# Patient Record
Sex: Female | Born: 1950 | Race: Black or African American | Hispanic: No | Smoking: Never smoker
Health system: Southern US, Community
[De-identification: ages and names within clinical notes are randomized; demographics above are authoritative.]

## PROBLEM LIST (undated history)

## (undated) DIAGNOSIS — I319 Disease of pericardium, unspecified: Secondary | ICD-10-CM

## (undated) DIAGNOSIS — I10 Essential (primary) hypertension: Secondary | ICD-10-CM

## (undated) DIAGNOSIS — K5909 Other constipation: Secondary | ICD-10-CM

## (undated) DIAGNOSIS — M5126 Other intervertebral disc displacement, lumbar region: Secondary | ICD-10-CM

## (undated) DIAGNOSIS — G4733 Obstructive sleep apnea (adult) (pediatric): Secondary | ICD-10-CM

## (undated) DIAGNOSIS — K5790 Diverticulosis of intestine, part unspecified, without perforation or abscess without bleeding: Secondary | ICD-10-CM

## (undated) DIAGNOSIS — K589 Irritable bowel syndrome without diarrhea: Secondary | ICD-10-CM

## (undated) DIAGNOSIS — E119 Type 2 diabetes mellitus without complications: Secondary | ICD-10-CM

## (undated) HISTORY — DX: Irritable bowel syndrome without diarrhea: K58.9

## (undated) HISTORY — DX: Obstructive sleep apnea (adult) (pediatric): G47.33

## (undated) HISTORY — DX: Other constipation: K59.09

## (undated) HISTORY — DX: Diverticulosis of intestine, part unspecified, without perforation or abscess without bleeding: K57.90

---

## 1974-01-14 HISTORY — PX: TUBAL LIGATION: SHX77

## 1997-08-04 ENCOUNTER — Emergency Department (HOSPITAL_COMMUNITY): Admission: EM | Admit: 1997-08-04 | Discharge: 1997-08-04 | Payer: Self-pay

## 1997-11-09 ENCOUNTER — Other Ambulatory Visit: Admission: RE | Admit: 1997-11-09 | Discharge: 1997-11-09 | Payer: Self-pay | Admitting: Obstetrics and Gynecology

## 1999-08-09 ENCOUNTER — Ambulatory Visit (HOSPITAL_COMMUNITY): Admission: RE | Admit: 1999-08-09 | Discharge: 1999-08-09 | Payer: Self-pay | Admitting: *Deleted

## 1999-09-27 ENCOUNTER — Emergency Department (HOSPITAL_COMMUNITY): Admission: EM | Admit: 1999-09-27 | Discharge: 1999-09-27 | Payer: Self-pay | Admitting: Emergency Medicine

## 1999-09-27 ENCOUNTER — Encounter: Payer: Self-pay | Admitting: Emergency Medicine

## 1999-11-21 ENCOUNTER — Other Ambulatory Visit: Admission: RE | Admit: 1999-11-21 | Discharge: 1999-11-21 | Payer: Self-pay | Admitting: Gastroenterology

## 1999-11-21 ENCOUNTER — Encounter (INDEPENDENT_AMBULATORY_CARE_PROVIDER_SITE_OTHER): Payer: Self-pay | Admitting: Specialist

## 2000-06-23 ENCOUNTER — Ambulatory Visit (HOSPITAL_COMMUNITY): Admission: RE | Admit: 2000-06-23 | Discharge: 2000-06-23 | Payer: Self-pay | Admitting: Gastroenterology

## 2001-12-08 ENCOUNTER — Emergency Department (HOSPITAL_COMMUNITY): Admission: EM | Admit: 2001-12-08 | Discharge: 2001-12-08 | Payer: Self-pay | Admitting: Emergency Medicine

## 2001-12-09 ENCOUNTER — Encounter: Payer: Self-pay | Admitting: Emergency Medicine

## 2008-06-23 ENCOUNTER — Encounter: Admission: RE | Admit: 2008-06-23 | Discharge: 2008-06-23 | Payer: Self-pay | Admitting: Family Medicine

## 2008-11-16 ENCOUNTER — Encounter: Admission: RE | Admit: 2008-11-16 | Discharge: 2009-01-11 | Payer: Self-pay | Admitting: Family Medicine

## 2010-06-01 NOTE — Cardiovascular Report (Signed)
[place]. [facility]  Patient:    Emily Huynh, Emily Huynh                          MRN: [id number] Proc. Date: 08/09/99 Adm. Date:  [id number] Attending:  Meade Maw A CC:         Duncan Dull, M.D.                        Cardiac Catheterization  PROCEDURES: 1. Left heart catheterization. 2. Coronary angiography. 3. Single plane ventriculogram.  INDICATION:  Defect in the anterior wall with questionable redistribution.  REFERRING PHYSICIAN:  Duncan Dull, M.D.  DESCRIPTION OF PROCEDURE:  After obtaining written informed consent, the patient was brought to the cardiac catheterization lab in the postabsorbtive state.  Preoperative sedation was achieved using IV Versed.  Right groin was prepped and draped in the usual sterile fashion.  Local anesthesia was achieved using 1% Xylocaine.  A 6 French sheath was placed into the right femoral artery using modified Seldinger technique.  Selective coronary angiography was performed using JL4 and JR4 Judkins catheter.  Single plane ventriculogram was performed in the RAO position using a 6 French pigtail curved catheters.  All catheters exchanges were made over the guide wire.  The hemostasis sheath was placed following 18 Judkins.  There was no identifiable coronary artery disease.  The patient was transferred to the holding area.  Hemostasis was achieved using additional pressure.  RESULTS:  Hemodynamic data: 1. Aortic pressure is 119/71. 2. LV pressure is 119/16 with an EDP of 14.  Left ventriculogram:  Left ventriculogram revealed normal wall motion with ejection fraction of 70%.  Angiographic data: 1. Left main coronary artery bifurcated into the left anterior descending    coronary artery and circumflex vessel.  There was no significant disease    in the left main coronary artery. 2. Left anterior descending coronary artery gave rise to a large D-1 inferior    apical recurrent branch.  There was no  significant disease in the LAD or    its branches. 3. The circumflex vessel gave rise to a moderate OM-1 and bifurcating OM-2.    In the AV groove vessel, there was no significant disease in the    circumflex vessel. 4. Right coronary artery was dominant and had no significant disease.  IMPRESSION: 1. False positive stress Cardiolite. 2. Normal coronaries. 3. Normal left ventriculogram. 4. Consider other etiologies for her chest pain. DD:  08/09/99 TD:  08/09/99 Job: [id number]

## 2013-10-06 ENCOUNTER — Ambulatory Visit: Payer: Self-pay | Admitting: Licensed Clinical Social Worker

## 2013-11-11 ENCOUNTER — Other Ambulatory Visit (HOSPITAL_COMMUNITY): Payer: Self-pay | Admitting: Family Medicine

## 2013-11-11 DIAGNOSIS — R071 Chest pain on breathing: Secondary | ICD-10-CM

## 2013-11-12 ENCOUNTER — Ambulatory Visit (HOSPITAL_COMMUNITY)
Admission: RE | Admit: 2013-11-12 | Discharge: 2013-11-12 | Disposition: A | Discharge status: Home / Self Care | Payer: BC Managed Care – PPO | Source: Ambulatory Visit | Attending: Family Medicine | Admitting: Family Medicine

## 2013-11-12 ENCOUNTER — Encounter (HOSPITAL_COMMUNITY): Payer: Self-pay

## 2013-11-12 DIAGNOSIS — I10 Essential (primary) hypertension: Secondary | ICD-10-CM | POA: Insufficient documentation

## 2013-11-12 DIAGNOSIS — R599 Enlarged lymph nodes, unspecified: Secondary | ICD-10-CM | POA: Insufficient documentation

## 2013-11-12 DIAGNOSIS — R071 Chest pain on breathing: Secondary | ICD-10-CM | POA: Diagnosis present

## 2013-11-12 DIAGNOSIS — R918 Other nonspecific abnormal finding of lung field: Secondary | ICD-10-CM | POA: Insufficient documentation

## 2013-11-12 DIAGNOSIS — E049 Nontoxic goiter, unspecified: Secondary | ICD-10-CM | POA: Diagnosis not present

## 2013-11-12 DIAGNOSIS — E119 Type 2 diabetes mellitus without complications: Secondary | ICD-10-CM | POA: Insufficient documentation

## 2013-11-12 HISTORY — DX: Essential (primary) hypertension: I10

## 2013-11-12 HISTORY — DX: Type 2 diabetes mellitus without complications: E11.9

## 2013-11-12 LAB — POCT I-STAT CREATININE: Creatinine, Ser: 1 mg/dL (ref 0.50–1.10)

## 2013-11-12 MED ORDER — IOHEXOL 350 MG/ML SOLN
70.0000 mL | Freq: Once | INTRAVENOUS | Status: AC | PRN
  Administered 2013-11-12: 70 mL via INTRAVENOUS

## 2013-11-16 ENCOUNTER — Other Ambulatory Visit: Payer: Self-pay | Admitting: Family Medicine

## 2013-11-16 DIAGNOSIS — E049 Nontoxic goiter, unspecified: Secondary | ICD-10-CM

## 2013-11-17 ENCOUNTER — Ambulatory Visit
Admission: RE | Admit: 2013-11-17 | Discharge: 2013-11-17 | Disposition: A | Discharge status: Home / Self Care | Payer: BC Managed Care – PPO | Source: Ambulatory Visit | Attending: Family Medicine | Admitting: Family Medicine

## 2013-11-17 DIAGNOSIS — E049 Nontoxic goiter, unspecified: Secondary | ICD-10-CM

## 2013-11-22 ENCOUNTER — Encounter: Payer: Self-pay | Admitting: Emergency Medicine

## 2013-11-23 ENCOUNTER — Ambulatory Visit (INDEPENDENT_AMBULATORY_CARE_PROVIDER_SITE_OTHER): Payer: BC Managed Care – PPO | Admitting: Emergency Medicine

## 2013-11-23 ENCOUNTER — Other Ambulatory Visit: Payer: BC Managed Care – PPO

## 2013-11-23 ENCOUNTER — Encounter: Payer: Self-pay | Admitting: Emergency Medicine

## 2013-11-23 VITALS — BP 130/86 | HR 77 | Temp 98.1°F | Ht 64.0 in | Wt 198.2 lb

## 2013-11-23 DIAGNOSIS — R918 Other nonspecific abnormal finding of lung field: Secondary | ICD-10-CM

## 2013-11-23 DIAGNOSIS — R0789 Other chest pain: Secondary | ICD-10-CM

## 2013-11-23 DIAGNOSIS — R590 Localized enlarged lymph nodes: Secondary | ICD-10-CM

## 2013-11-23 DIAGNOSIS — R599 Enlarged lymph nodes, unspecified: Secondary | ICD-10-CM

## 2013-11-23 DIAGNOSIS — R938 Abnormal findings on diagnostic imaging of other specified body structures: Secondary | ICD-10-CM

## 2013-11-23 LAB — ANGIOTENSIN CONVERTING ENZYME: Angiotensin-Converting Enzyme: 24 U/L (ref 8–52)

## 2013-11-23 LAB — RHEUMATOID FACTOR: Rhuematoid fact SerPl-aCnc: 18 IU/mL — ABNORMAL HIGH (ref <=14)

## 2013-11-23 NOTE — Patient Instructions (Signed)
We will check blood work today We will repeat your CT scan of the chest in late December Follow with Dr Delton CoombesByrum in December after your Ct scan to review

## 2013-11-23 NOTE — Progress Notes (Signed)
Subjective:    Patient ID: Emily Huynh, female    DOB: 06/22/1950, 63 y.o.   MRN: [id number]  HPI 2961 never smoker with hx of HTN, DM, OSA, diverticular disease.  Began to have R sided CP about 3 months ago after a trip to [place]. Seems to be present at all times, worse w a deep breath. No real cough or SOB. She does not recall any physical injury. She underwent CT-PA 11/12/13 that showed no PE but did have a small nodule and R hilar lymph node.    Review of Systems  Constitutional: Negative for fever and unexpected weight change.  HENT: Negative for congestion, dental problem, ear pain, nosebleeds, postnasal drip, rhinorrhea, sinus pressure, sneezing, sore throat and trouble swallowing.   Eyes: Negative for redness and itching.  Respiratory: Negative for cough, chest tightness, shortness of breath and wheezing.   Cardiovascular: Positive for chest pain. Negative for palpitations and leg swelling.  Gastrointestinal: Negative for nausea and vomiting.  Genitourinary: Negative for dysuria.  Musculoskeletal: Negative for joint swelling.  Skin: Negative for rash.  Neurological: Negative for headaches.  Hematological: Does not bruise/bleed easily.  Psychiatric/Behavioral: Negative for dysphoric mood. The patient is not nervous/anxious.     Past Medical History  Diagnosis Date  . Hypertension   . Diabetes mellitus without complication   . Chronic constipation   . IBS (irritable bowel syndrome)   . OSA (obstructive sleep apnea)   . Diverticulosis      Family History  Problem Relation Age of Onset  . Diabetes Mother   . Hypertension Mother   . Dementia Mother     deceased  . Hypertension Brother   . Heart attack Maternal Grandfather   . Dementia Maternal Grandmother   . Cancer Maternal Aunt     Breast  . Diabetes Paternal Grandmother   . Heart attack Paternal Uncle   . Cancer - Prostate Father      History   Social History  . Marital Status: Unknown   Spouse Name: N/A    Number of Children: 3  . 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 Smoking status: Never Smoker   . Smokeless tobacco: Never Used  . Alcohol Use: No  . Drug Use: No  . Sexual Activity: Not on file   Other Topics Concern  . Not on file   Social History Narrative     No Known Allergies   Outpatient Prescriptions Prior to Visit  Medication Sig Dispense Refill  . aspirin 81 MG chewable tablet Chew 81 mg by mouth as needed (3x week).    . magnesium oxide (MAG-OX) 400 MG tablet Take 400 mg by mouth as needed (8 tabs weekly prn).    . vitamin C (ASCORBIC ACID) 500 MG tablet Take 500 mg by mouth as needed.     No facility-administered medications prior to visit.         Objective:   Physical Exam  Filed Vitals:   11/23/13 0913  BP: 130/86  Pulse: 77  Temp: 98.1 F (36.7 C)  TempSrc: Oral  Height: 5\' 4"  (1.626 m)  Weight: 198 lb 3.2 oz (89.903 kg)  SpO2: 97%   Gen: Pleasant, overwt, in no distress,  normal affect  ENT: No lesions,  mouth clear,  oropharynx clear, no postnasal drip  Neck: No JVD, no TMG, no carotid bruits  Lungs: No use of  accessory muscles, clear without rales or rhonchi  Cardiovascular: RRR, heart sounds normal, no peripheral edema  Musculoskeletal: No deformities, no cyanosis or clubbing, no pain on palpation of the chest wall  Neuro: alert, non focal  Skin: Warm, no lesions or rashes        Assessment & Plan:  Hilar lymphadenopathy Will repeat her CT scan in Dec '15 to look for interval change check auto-immune labs rov after Ct to review

## 2013-11-24 LAB — ANCA SCREEN W REFLEX TITER
Atypical p-ANCA Screen: NEGATIVE
C-ANCA SCREEN: NEGATIVE
p-ANCA Screen: NEGATIVE

## 2013-11-24 LAB — ANA: Anti Nuclear Antibody(ANA): NEGATIVE

## 2013-11-24 LAB — ANTI-SCLERODERMA ANTIBODY: Scleroderma (Scl-70) (ENA) Antibody, IgG: <1

## 2013-11-24 LAB — ANTI-DNA ANTIBODY, DOUBLE-STRANDED: DS DNA AB: 1 [IU]/mL

## 2013-11-29 ENCOUNTER — Telehealth: Payer: Self-pay | Admitting: Emergency Medicine

## 2013-11-29 NOTE — Telephone Encounter (Signed)
Called and spoke to pt. Pt requesting labs that were drawn on 11/10. Pt aware once they have been reviewed we will call and inform her. Pt verbalized understanding.  RB please advise on results. Thanks.

## 2013-12-03 NOTE — Telephone Encounter (Signed)
Please let her know that her tests are all normal with the exception of her rheumatoid factor, which was low positive. The significance of this is unclear since there is only a very mild elevation (which can happen in absence of any disease). We will need to discuss further at her ROV

## 2013-12-03 NOTE — Telephone Encounter (Signed)
LMTCB

## 2013-12-06 NOTE — Telephone Encounter (Signed)
Called and spoke with pt and she is aware of lab results per RB.  Pt voiced her understanding and nothing further is needed.

## 2014-01-11 ENCOUNTER — Inpatient Hospital Stay: Admission: RE | Admit: 2014-01-11 | Payer: BC Managed Care – PPO | Source: Ambulatory Visit

## 2014-01-12 ENCOUNTER — Ambulatory Visit: Payer: BC Managed Care – PPO | Admitting: Emergency Medicine

## 2014-01-17 ENCOUNTER — Ambulatory Visit (INDEPENDENT_AMBULATORY_CARE_PROVIDER_SITE_OTHER)
Admission: RE | Admit: 2014-01-17 | Discharge: 2014-01-17 | Disposition: A | Discharge status: Home / Self Care | Payer: BC Managed Care – PPO | Source: Ambulatory Visit | Attending: Emergency Medicine | Admitting: Emergency Medicine

## 2014-01-17 DIAGNOSIS — R938 Abnormal findings on diagnostic imaging of other specified body structures: Secondary | ICD-10-CM

## 2014-01-21 ENCOUNTER — Encounter: Payer: Self-pay | Admitting: Emergency Medicine

## 2014-01-21 ENCOUNTER — Ambulatory Visit (INDEPENDENT_AMBULATORY_CARE_PROVIDER_SITE_OTHER): Payer: BC Managed Care – PPO | Admitting: Emergency Medicine

## 2014-01-21 VITALS — BP 138/88 | HR 79 | Temp 97.8°F | Ht 64.0 in | Wt 197.6 lb

## 2014-01-21 DIAGNOSIS — R599 Enlarged lymph nodes, unspecified: Secondary | ICD-10-CM

## 2014-01-21 DIAGNOSIS — R911 Solitary pulmonary nodule: Secondary | ICD-10-CM

## 2014-01-21 DIAGNOSIS — R590 Localized enlarged lymph nodes: Secondary | ICD-10-CM | POA: Insufficient documentation

## 2014-01-21 NOTE — Assessment & Plan Note (Signed)
Will repeat her CT scan in Dec '15 to look for interval change check auto-immune labs rov after Ct to review

## 2014-01-21 NOTE — Assessment & Plan Note (Signed)
Small 3 mm nodule that likely does not merit follow-up in a low risk patient except I am concerned about it in conjunction with the right hilar node and her chest discomfort. For this reason I will follow it.

## 2014-01-21 NOTE — Progress Notes (Signed)
Subjective:    Patient ID: Emily Huynh, female    DOB: 01/11/1951, 64 y.o.   MRN: [id number]  HPI 5861 never smoker with hx of HTN, DM, OSA, diverticular disease.  Began to have R sided CP about 3 months ago after a trip to [place]. Seems to be present at all times, worse w a deep breath. No real cough or SOB. She does not recall any physical injury. She underwent CT-PA 11/12/13 that showed no PE but did have a small nodule and R hilar lymph node.   ROV 01/21/14 -- follow up visit for R side CP that prompted a CT-PA 11/12/13. The scan was negative for PE but showed some non-specific R hilar nodal enlargement and a 3mm RUL nodule. She underwent repeat Ct scan 01/17/14 that showed no interval change in either finding. Her CP has improved some, but can still occur w a deep breath. It feels deep in her chest.    Review of Systems  Constitutional: Negative for fever and unexpected weight change.  HENT: Negative for congestion, dental problem, ear pain, nosebleeds, postnasal drip, rhinorrhea, sinus pressure, sneezing, sore throat and trouble swallowing.   Eyes: Negative for redness and itching.  Respiratory: Negative for cough, chest tightness, shortness of breath and wheezing.   Cardiovascular: Positive for chest pain. Negative for palpitations and leg swelling.  Gastrointestinal: Negative for nausea and vomiting.  Genitourinary: Negative for dysuria.  Musculoskeletal: Negative for joint swelling.  Skin: Negative for rash.  Neurological: Negative for headaches.  Hematological: Does not bruise/bleed easily.  Psychiatric/Behavioral: Negative for dysphoric mood. The patient is not nervous/anxious.      Past Medical History  Diagnosis Date  . Hypertension   . Diabetes mellitus without complication   . Chronic constipation   . IBS (irritable bowel syndrome)   . OSA (obstructive sleep apnea)   . Diverticulosis      Family History  Problem Relation Age of Onset  . Diabetes Mother    . Hypertension Mother   . Dementia Mother     deceased  . Hypertension Brother   . Heart attack Maternal Grandfather   . Dementia Maternal Grandmother   . Cancer Maternal Aunt     Breast  . Diabetes Paternal Grandmother   . Heart attack Paternal Uncle   . Cancer - Prostate Father      History   Social History  . Marital Status: Unknown    Spouse Name: N/A    Number of Children: 3  . 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 Smoking status: Never Smoker   . Smokeless tobacco: Never Used  . Alcohol Use: No  . Drug Use: No  . Sexual Activity: Not on file   Other Topics Concern  . Not on file   Social History Narrative     No Known Allergies   Outpatient Prescriptions Prior to Visit  Medication Sig Dispense Refill  . aspirin 81 MG chewable tablet Chew 81 mg by mouth as needed (3x week).    . magnesium oxide (MAG-OX) 400 MG tablet Take 400 mg by mouth as needed (8 tabs weekly prn).    . vitamin C (ASCORBIC ACID) 500 MG tablet Take 500 mg by mouth as needed.     No facility-administered medications prior to visit.         Objective:   Physical Exam Filed Vitals:   01/21/14  1632  BP: 138/88  Pulse: 79  Temp: 97.8 F (36.6 C)  TempSrc: Oral  Height:  (1.626 m)  Weight: 197 lb 9.6 oz (89.631 kg)  SpO2: 98%   Gen: Pleasant, overwt, in no distress,  normal affect  ENT: No lesions,  mouth clear,  oropharynx clear, no postnasal drip  Neck: No JVD, no TMG, no carotid bruits  Lungs: No use of accessory muscles,  clear without rales or rhonchi  Cardiovascular: RRR, heart sounds normal, no murmur or gallops, no peripheral edema  Musculoskeletal: No deformities, no cyanosis or clubbing  Neuro: alert, non focal  Skin: Warm, no lesions or rashes      Assessment & Plan:  Hilar lymphadenopathy Difficult case in that both the hilar lymph node and the small pulmonary nodule  are nonspecific findings. We discussed the fact that both lesions may be benign but that the etiology is unclear. I do not believe either finding is related to her chest discomfort. We discussed the possibility of repeating a CT scan in 6 months to look for interval change versus proceeding to bronchoscopy with endobronchial ultrasound for nodal biopsy. She is going to think about the options and call me with her decision. If she does not want a biopsy then we will repeat her CT in 6 months.   Solitary pulmonary nodule Small 3 mm nodule that likely does not merit follow-up in a low risk patient except I am concerned about it in conjunction with the right hilar node and her chest discomfort. For this reason I will follow it.

## 2014-01-21 NOTE — Patient Instructions (Signed)
Your CT scan shows a stable 3mm right upper lobe nodule and a 1.2cm enlarged right hilar lymph node. Both of these findings are non-specific and are likely unrelated to your chest discomfort.  We discussed 2 options today > following your CT scan in 6 months versus performing a bronchoscopy with endobronchial ultrasound to hopefully sample the lymph node. Please think about these options and call our office to discuss which option you prefer.  Your Rheumatoid Factor is slightly elevated. This is a non-specific finding but we may decide to recheck or pursue it. All of your other lab work was normal.  Follow with Dr Delton CoombesByrum next available appointment.

## 2014-01-21 NOTE — Assessment & Plan Note (Signed)
Difficult case in that both the hilar lymph node and the small pulmonary nodule are nonspecific findings. We discussed the fact that both lesions may be benign but that the etiology is unclear. I do not believe either finding is related to her chest discomfort. We discussed the possibility of repeating a CT scan in 6 months to look for interval change versus proceeding to bronchoscopy with endobronchial ultrasound for nodal biopsy. She is going to think about the options and call me with her decision. If she does not want a biopsy then we will repeat her CT in 6 months.

## 2014-01-27 ENCOUNTER — Telehealth: Payer: Self-pay | Admitting: Emergency Medicine

## 2014-01-27 DIAGNOSIS — R911 Solitary pulmonary nodule: Secondary | ICD-10-CM

## 2014-01-27 NOTE — Telephone Encounter (Signed)
lmtcb X1 for pt  

## 2014-01-28 NOTE — Telephone Encounter (Signed)
Spoke with the pt  She is asking how big her nodules are  What is the width? She states that this will help her decide on what to do next  Please advise thanks!

## 2014-02-02 NOTE — Telephone Encounter (Signed)
RB please advise. Thanks.  

## 2014-02-07 NOTE — Telephone Encounter (Signed)
Reviewed CT scan results with pt by phone. Explained the findings. Recommended that we repeat her Ct scan of the chest in 6 months or sooner if she has new sx

## 2014-07-19 ENCOUNTER — Ambulatory Visit (INDEPENDENT_AMBULATORY_CARE_PROVIDER_SITE_OTHER)
Admission: RE | Admit: 2014-07-19 | Discharge: 2014-07-19 | Disposition: A | Discharge status: Home / Self Care | Payer: BC Managed Care – PPO | Source: Ambulatory Visit | Attending: Emergency Medicine | Admitting: Emergency Medicine

## 2014-07-19 DIAGNOSIS — R911 Solitary pulmonary nodule: Secondary | ICD-10-CM

## 2014-08-09 ENCOUNTER — Other Ambulatory Visit: Payer: Self-pay | Admitting: General Surgery

## 2014-08-09 NOTE — H&P (Signed)
Emily Huynh 08/09/2014 10:49 AM Location: [facility] Patient #: [id number] DOB: July 18, 1950 Divorced / Language: [place] / Race: Black or African American Female History of Present Illness Emily Levee MD; 08/09/2014 12:05 PM) Patient words: hems.  The patient is a 64 year old female who presents with hemorrhoids. 64 year old female who presents to the office with long-standing issues with internal and external hemorrhoids. Her symptoms are mostly consistent with hygiene issues as well as mucosal leakage throughout the day. She has occasional bleeding as well. She states mostly regular bowel movements and uses a fiber supplement with a high-fiber diet due to her diverticulosis. She denies any straining with bowel movements. She has never had any hemorrhoid procedures before. Other Problems Fay Records, CMA; 08/09/2014 10:49 AM) Diabetes Mellitus Diverticulosis High blood pressure Sleep Apnea  Past Surgical History Fay Records, CMA; 08/09/2014 10:49 AM) Oral Surgery  Diagnostic Studies History Fay Records, [place]; 08/09/2014 10:49 AM) Colonoscopy within last year Mammogram within last year  Allergies Fay Records, CMA; 08/09/2014 10:49 AM) No Known Drug Allergies 08/09/2014  Medication History Fay Records, CMA; 08/09/2014 10:50 AM) Levothyroxine Sodium ( Tablet, Oral) Active. Aspirin EC (81MG  Tablet DR, Oral) Active. Magnesium Oxide (400MG  Tablet, Oral as needed) Active. Vitamin C (500MG  Tablet, Oral daily) Active. Medications Reconciled  Social History Fay Records, [place]; 08/09/2014 10:49 AM) Alcohol use Occasional alcohol use. Caffeine use Carbonated beverages, Coffee, Tea. No drug use Tobacco use Never smoker.  Family History Fay Records, [place]; 08/09/2014 10:49 AM) Colon Polyps Father. Diabetes Mellitus Father, Mother. Heart Disease Father, Mother. Heart disease in female family member before age 20 Hypertension Brother, Father,  Mother. Thyroid problems Daughter.  Pregnancy / Birth History Fay Records, CMA; 08/09/2014 10:49 AM) Age at menarche 15 years. Gravida 3 Maternal age 109-20 Para 3     Review of Systems Fay Records CMA; 08/09/2014 10:49 AM) General Present- Weight Gain. Not Present- Appetite Loss, Chills, Fatigue, Fever, Night Sweats and Weight Loss. Skin Not Present- Change in Wart/Mole, Dryness, Hives, Jaundice, New Lesions, Non-Healing Wounds, Rash and Ulcer. HEENT Present- Seasonal Allergies. Not Present- Earache, Hearing Loss, Hoarseness, Nose Bleed, Oral Ulcers, Ringing in the Ears, Sinus Pain, Sore Throat, Visual Disturbances, Wears glasses/contact lenses and Yellow Eyes. Respiratory Present- Snoring. Not Present- Bloody sputum, Chronic Cough, Difficulty Breathing and Wheezing. Breast Not Present- Breast Mass, Breast Pain, Nipple Discharge and Skin Changes. Gastrointestinal Present- Abdominal Pain, Bloating, Change in Bowel Habits, Constipation, Excessive gas, Hemorrhoids and Rectal Pain. Not Present- Bloody Stool, Chronic diarrhea, Difficulty Swallowing, Gets full quickly at meals, Indigestion, Nausea and Vomiting. Female Genitourinary Present- Frequency. Not Present- Nocturia, Painful Urination, Pelvic Pain and Urgency. Neurological Present- Headaches. Not Present- Decreased Memory, Fainting, Numbness, Seizures, Tingling, Tremor, Trouble walking and Weakness. Psychiatric Present- Change in Sleep Pattern. Not Present- Anxiety, Bipolar, Depression, Fearful and Frequent crying. Endocrine Present- New Diabetes. Not Present- Cold Intolerance, Excessive Hunger, Hair Changes, Heat Intolerance and Hot flashes. Hematology Not Present- Easy Bruising, Excessive bleeding, Gland problems, HIV and Persistent Infections.  Vitals Fay Records CMA; 08/09/2014 10:51 AM) 08/09/2014 10:50 AM Weight: 196 lb Height: 64in Body Surface Area: 2 m Body Mass Index: 33.64 kg/m Temp.: 98.56F(Oral)  Pulse: 87  (Regular)  BP: 128/78 (Sitting, Left Arm, Standard)     Physical Exam Emily Levee MD; 08/09/2014 12:05 PM)  General Mental Status-Alert. General Appearance-Consistent with stated age. Hydration-Well hydrated. Voice-Normal.  Head and Neck Head-normocephalic, atraumatic with no lesions or palpable masses. Trachea-midline. Thyroid Gland Characteristics - normal size and consistency.  Eye Eyeball - Bilateral-Extraocular movements intact. Sclera/Conjunctiva - Bilateral-No scleral icterus.  Chest and Lung Exam Chest and lung exam reveals -quiet, even and easy respiratory effort with no use of accessory muscles and on auscultation, normal breath sounds, no adventitious sounds and normal vocal resonance. Inspection Chest Wall - Normal. Back - normal.  Cardiovascular Cardiovascular examination reveals -normal heart sounds, regular rate and rhythm with no murmurs and normal pedal pulses bilaterally.  Abdomen Inspection Inspection of the abdomen reveals - No Hernias. Palpation/Percussion Palpation and Percussion of the abdomen reveal - Soft, Non Tender, No Rebound tenderness, No Rigidity (guarding) and No hepatosplenomegaly. Auscultation Auscultation of the abdomen reveals - Bowel sounds normal.  Rectal Anorectal Exam External - Non-tender. Note: 2 external skin tags noted. Internal - normal sphincter tone, Non-tender.  Neurologic Neurologic evaluation reveals -alert and oriented x 3 with no impairment of recent or remote memory. Mental Status-Normal.  Musculoskeletal Global Assessment -Note:no gross deformities.  Normal Exam - Left-Upper Extremity Strength Normal and Lower Extremity Strength Normal. Normal Exam - Right-Upper Extremity Strength Normal and Lower Extremity Strength Normal.    Assessment & Plan Emily Levee MD; 08/09/2014 12:03 PM)  PROLAPSED INTERNAL HEMORRHOIDS, GRADE 3 (455.2  K64.2) Impression: 64 year old female  who presents to the office with hemorrhoids. Her symptoms include bleeding and prolapse with leakage. My exam shows a grade 3 left posterior lateral internal hemorrhoid and grade 2 right anterior and right posterior internal hemorrhoids. She also has some external hemorrhoid disease and 2 small anal polyps. We discussed hemorrhoidectomy versus THD and the risk and benefits of both. The patient is leaning towards THD. We will work on starting to schedule this. Risks include bleeding, recurrence and pain.

## 2014-08-16 ENCOUNTER — Telehealth: Payer: Self-pay | Admitting: Emergency Medicine

## 2014-08-16 NOTE — Telephone Encounter (Signed)
LMTCB

## 2014-08-17 NOTE — Telephone Encounter (Signed)
Pt is requesting her CT results from 07/19/14.  Pt wishes to wait until RB is available to review these - does not want another provider to result.  Aware that we will send a message to Dr Delton Coombes to address as soon as he returns from vacation.  RB-please advise. Thanks.

## 2014-08-22 NOTE — Telephone Encounter (Signed)
I reviewed the results of the CT scan with her. The nodule and mediastinal LAD are both stable. She has not evolved any new sx. I believe we can likely repeat her scan in 1 year. She will make an OV with me to discuss timing of a repeat scan vs biopsy.

## 2014-09-13 ENCOUNTER — Other Ambulatory Visit: Payer: Self-pay | Admitting: Emergency Medicine

## 2014-09-13 DIAGNOSIS — R918 Other nonspecific abnormal finding of lung field: Secondary | ICD-10-CM

## 2014-11-29 ENCOUNTER — Ambulatory Visit: Payer: BC Managed Care – PPO | Admitting: Emergency Medicine

## 2014-11-29 ENCOUNTER — Telehealth: Payer: Self-pay | Admitting: Emergency Medicine

## 2014-11-29 NOTE — Telephone Encounter (Signed)
lmtcb x1 for pt. RB's schedule can be double booked on either 12/05/14 at 9am or 12/12/14 at 1:30pm.

## 2014-11-30 NOTE — Telephone Encounter (Signed)
I scheduled patient for the 12/05/2014 at 9:00 am appointment.

## 2014-12-05 ENCOUNTER — Encounter: Payer: Self-pay | Admitting: Emergency Medicine

## 2014-12-05 ENCOUNTER — Ambulatory Visit (INDEPENDENT_AMBULATORY_CARE_PROVIDER_SITE_OTHER): Payer: BC Managed Care – PPO | Admitting: Emergency Medicine

## 2014-12-05 VITALS — BP 144/100 | HR 78 | Ht 64.0 in | Wt 204.0 lb

## 2014-12-05 DIAGNOSIS — R599 Enlarged lymph nodes, unspecified: Secondary | ICD-10-CM

## 2014-12-05 DIAGNOSIS — R911 Solitary pulmonary nodule: Secondary | ICD-10-CM | POA: Diagnosis not present

## 2014-12-05 DIAGNOSIS — R05 Cough: Secondary | ICD-10-CM | POA: Diagnosis not present

## 2014-12-05 DIAGNOSIS — R059 Cough, unspecified: Secondary | ICD-10-CM

## 2014-12-05 DIAGNOSIS — R590 Localized enlarged lymph nodes: Secondary | ICD-10-CM

## 2014-12-05 MED ORDER — FLUTICASONE PROPIONATE 50 MCG/ACT NA SUSP: 2.0000 | Freq: Every day | NASAL | Status: DC

## 2014-12-05 NOTE — Patient Instructions (Signed)
We will repeat your CT scan of the chest in July 2017 Please continue loratadine 10 mg daily Try starting fluticasone nasal spray, 2 sprays each nostril once a day If her cough continues after better treating your allergic rhinitis and would like for you to try starting omeprazole (over-the-counter Prilosec) 20 mg once a day. Take this medicine for at least a month to see if it helps with cough Follow with Dr Delton CoombesByrum in  July 2017 after the CT scan to review the results.

## 2014-12-05 NOTE — Assessment & Plan Note (Signed)
Repeat CT scan of the chest in July 2017

## 2014-12-05 NOTE — Addendum Note (Signed)
Addended by: Jaynee EaglesLEMONS, Castor Gittleman C on: 12/05/2014 09:27 AM   Modules accepted: Orders

## 2014-12-05 NOTE — Assessment & Plan Note (Signed)
In the setting of only marginally controlled allergic disease. I discussed the benefits of treating this more aggressively with her today and we will add fluticasone nasal spray. Also discussed the potential contribution of silent reflux. If her cough does not get better on loratadine plus takes then she will add omeprazole to see if she benefits.

## 2014-12-05 NOTE — Progress Notes (Signed)
Subjective:    Patient ID: Emily Huynh, female    DOB: 06-18-1950, 64 y.o.   MRN: [id number]  HPI 61 never smoker with hx of HTN, DM, OSA, diverticular disease.  Began to have R sided CP about 3 months ago after a trip to [place]. Seems to be present at all times, worse w a deep breath. No real cough or SOB. She does not recall any physical injury. She underwent CT-PA 11/12/13 that showed no PE but did have a small nodule and R hilar lymph node.   ROV 01/21/14 -- follow up visit for R side CP that prompted a CT-PA 11/12/13. The scan was negative for PE but showed some non-specific R hilar nodal enlargement and a 3mm RUL nodule. She underwent repeat Ct scan 01/17/14 that showed no interval change in either finding. Her CP has improved some, but can still occur w a deep breath. It feels deep in her chest.   ROV 12/05/14 -- follow-up visit for abnormal CT scan of the chest dating back to 11/12/13. She had right hilar nodal enlargement with 3 mm right upper lobe nodule of unclear etiology. This is been stable on serial CTs most recently 07/19/14 which I personally reviewed. She is doing well, but continues to have a dry cough. A little more SOB which she ascribes to her weight.  She denies significant GERD presently. She is on loratadine.    Review of Systems  Constitutional: Negative for fever and unexpected weight change.  HENT: Negative for congestion, dental problem, ear pain, nosebleeds, postnasal drip, rhinorrhea, sinus pressure, sneezing, sore throat and trouble swallowing.   Eyes: Negative for redness and itching.  Respiratory: Negative for cough, chest tightness, shortness of breath and wheezing.   Cardiovascular: Positive for chest pain. Negative for palpitations and leg swelling.  Gastrointestinal: Negative for nausea and vomiting.  Genitourinary: Negative for dysuria.  Musculoskeletal: Negative for joint swelling.  Skin: Negative for rash.  Neurological: Negative for  headaches.  Hematological: Does not bruise/bleed easily.  Psychiatric/Behavioral: Negative for dysphoric mood. The patient is not nervous/anxious.      Past Medical History  Diagnosis Date  . Hypertension   . Diabetes mellitus without complication (HCC)   . Chronic constipation   . IBS (irritable bowel syndrome)   . OSA (obstructive sleep apnea)   . Diverticulosis      Family History  Problem Relation Age of Onset  . Diabetes Mother   . Hypertension Mother   . Dementia Mother     deceased  . Hypertension Brother   . Heart attack Maternal Grandfather   . Dementia Maternal Grandmother   . Cancer Maternal Aunt     Breast  . Diabetes Paternal Grandmother   . Heart attack Paternal Uncle   . Cancer - Prostate Father      Social History   Social History  . Marital Status: Unknown    Spouse Name: N/A  . Number of Children: 3  . Years of Education: N/A   Occupational History  . Grant Administrator [employer]   Social History Main Topics  . Smoking status: Never Smoker   . Smokeless tobacco: Never Used  . Alcohol Use: No  . Drug Use: No  . Sexual Activity: Not on file   Other Topics Concern  . Not on file   Social History Narrative     No Known Allergies   Outpatient Prescriptions Prior to Visit  Medication Sig Dispense Refill  . aspirin  81 MG chewable tablet Chew 81 mg by mouth as needed (3x week).    Marland Kitchen. levothyroxine (SYNTHROID, LEVOTHROID) 50 MCG tablet Take 50 mcg by mouth daily before breakfast.   1  . magnesium oxide (MAG-OX) 400 MG tablet Take 400 mg by mouth as needed (8 tabs weekly prn).    . vitamin C (ASCORBIC ACID) 500 MG tablet Take 500 mg by mouth as needed.     No facility-administered medications prior to visit.         Objective:   Physical Exam Filed Vitals:   12/05/14 0901  BP: 144/100  Pulse: 78  Height: 5\' 4"  (1.626 m)  Weight: 204 lb (92.534 kg)  SpO2: 98%   Gen: Pleasant, overwt, in no distress,  normal affect  ENT: No  lesions,  mouth clear,  oropharynx clear, no postnasal drip  Neck: No JVD, no TMG, no carotid bruits  Lungs: No use of accessory muscles,  clear without rales or rhonchi  Cardiovascular: RRR, heart sounds normal, no murmur or gallops, no peripheral edema  Musculoskeletal: No deformities, no cyanosis or clubbing  Neuro: alert, non focal  Skin: Warm, no lesions or rashes   07/19/14 --  COMPARISON: Chest CTs dated 11/12/2013 and 01/17/2014.  FINDINGS: Mediastinum/Nodes: Previously demonstrated prominent right hilar lymph node is not well seen without intravenous contrast, although is grossly stable. No progressive mediastinal or hilar adenopathy demonstrated. Generalized thyromegaly appears unchanged. The heart size is normal. There is no pericardial effusion.There are no significant vascular findings on noncontrast imaging for  Lungs/Pleura: There is no pleural effusion. 4 mm right upper lobe nodule on image 24 appears stable. No new or enlarging pulmonary nodules identified.  Upper abdomen: Hepatic steatosis and small low-density hepatic lesions on images 49 and 50 are stable. These are unchanged from abdominal CT done in 2010, consistent with benign findings.  Musculoskeletal/Chest wall: There is no chest wall mass or suspicious osseous finding.  IMPRESSION: 1. Stable right upper lobe pulmonary nodule from baseline examination of 8 months ago. If the patient is at low risk for bronchogenic carcinoma, no further follow-up necessary. In a high risk patient, 12 months of follow-up are recommended; consider 1 additional follow-up examination in approximately 6 months. This recommendation follows the consensus statement: Guidelines for Management of Small Pulmonary Nodules Detected on CT Scans: A Statement from the Fleischner Society as published in Radiology 2005; 237:395-400. 2. No acute findings. 3. Grossly stable prominence of the right hilum. No  progressive adenopathy identified on noncontrast imaging. 4. Stable low-density hepatic lesions     Assessment & Plan:  Cough In the setting of only marginally controlled allergic disease. I discussed the benefits of treating this more aggressively with her today and we will add fluticasone nasal spray. Also discussed the potential contribution of silent reflux. If her cough does not get better on loratadine plus takes then she will add omeprazole to see if she benefits.   Solitary pulmonary nodule Repeat CT scan of the chest in July 2017  Hilar lymphadenopathy Repeat CT scan of the test in July 17

## 2014-12-05 NOTE — Assessment & Plan Note (Signed)
Repeat CT scan of the test in July 17

## 2015-01-25 DIAGNOSIS — R002 Palpitations: Secondary | ICD-10-CM | POA: Insufficient documentation

## 2015-01-26 ENCOUNTER — Ambulatory Visit (INDEPENDENT_AMBULATORY_CARE_PROVIDER_SITE_OTHER): Payer: BC Managed Care – PPO

## 2015-01-26 DIAGNOSIS — R002 Palpitations: Secondary | ICD-10-CM | POA: Diagnosis not present

## 2015-04-03 ENCOUNTER — Other Ambulatory Visit: Payer: Self-pay | Admitting: General Surgery

## 2015-04-03 NOTE — H&P (Signed)
Emily ForemanCathy J. Huynh 04/03/2015 2:50 PM Location: [facility] Patient #: [id number] DOB: 11/17/1950 Divorced / Language: [place] / Race: Black or African American Female  History of Present Illness Emily Huynh(Marelyn Rouser MD; 04/03/2015 5:42 PM) The patient is a 65 year old female who presents with hemorrhoids. 65 year old female who presents to the office with long-standing issues with internal and external hemorrhoids. Her symptoms are mostly consistent with hygiene issues as well as mucosal leakage throughout the day. She has occasional bleeding as well. She states mostly regular bowel movements and uses a fiber supplement with a high-fiber diet due to her diverticulosis. She denies any straining with bowel movements. She has never had any hemorrhoid procedures before.   Problem List/Past Medical Emily Huynh(Mija Effertz, MD; 04/03/2015 5:43 PM) PROLAPSED INTERNAL HEMORRHOIDS, GRADE 3 (E45.4(K64.2)  Other Problems Emily Huynh(Jeanmarie Mccowen, MD; 04/03/2015 5:43 PM) High blood pressure Sleep Apnea Diverticulosis Diabetes Mellitus  Past Surgical History Emily Huynh(Romi Rathel, MD; 04/03/2015 5:43 PM) Oral Surgery  Diagnostic Studies History Emily Huynh(Cinde Ebert, MD; 04/03/2015 5:43 PM) Colonoscopy within last year Mammogram within last year  Allergies Fay Records(Ashley Beck, CMA; 04/03/2015 2:50 PM) No Known Drug Allergies 08/09/2014  Medication History Fay Records(Ashley Beck, CMA; 04/03/2015 2:50 PM) Levothyroxine Sodium (50MCG Tablet, Oral) Active. Aspirin EC (81MG  Tablet DR, Oral) Active. Magnesium Oxide (400MG  Tablet, Oral as needed) Active. Vitamin C (500MG  Tablet, Oral daily) Active. Medications Reconciled  Social History Emily Huynh(Brixon Zhen, MD; 04/03/2015 5:43 PM) Caffeine use Carbonated beverages, Coffee, Tea. No drug use Alcohol use Occasional alcohol use. Tobacco use Never smoker.  Family History Emily Huynh(Arienne Gartin, MD; 04/03/2015 5:43 PM) Colon Polyps Father. Diabetes Mellitus Father, Mother. Thyroid problems  Daughter. Hypertension Brother, Father, Mother. Heart Disease Father, Mother. Heart disease in female family member before age 63  Pregnancy / Birth History Emily Huynh(Montreal Steidle, MD; 04/03/2015 5:43 PM) Para 3 Maternal age 63-20 Age at menarche 13 years. Gravida 3     Review of Systems Emily Huynh(Nayleen Janosik MD; 04/03/2015 5:43 PM) General Present- Weight Gain. Not Present- Appetite Loss, Chills, Fatigue, Fever, Night Sweats and Weight Loss. Skin Not Present- Change in Wart/Mole, Dryness, Hives, Jaundice, New Lesions, Non-Healing Wounds, Rash and Ulcer. HEENT Present- Seasonal Allergies. Not Present- Earache, Hearing Loss, Hoarseness, Nose Bleed, Oral Ulcers, Ringing in the Ears, Sinus Pain, Sore Throat, Visual Disturbances, Wears glasses/contact lenses and Yellow Eyes. Respiratory Present- Snoring. Not Present- Bloody sputum, Chronic Cough, Difficulty Breathing and Wheezing. Breast Not Present- Breast Mass, Breast Pain, Nipple Discharge and Skin Changes. Gastrointestinal Present- Abdominal Pain, Bloating, Change in Bowel Habits, Constipation, Excessive gas, Hemorrhoids and Rectal Pain. Not Present- Bloody Stool, Chronic diarrhea, Difficulty Swallowing, Gets full quickly at meals, Indigestion, Nausea and Vomiting. Female Genitourinary Present- Frequency. Not Present- Nocturia, Painful Urination, Pelvic Pain and Urgency. Neurological Present- Headaches. Not Present- Decreased Memory, Fainting, Numbness, Seizures, Tingling, Tremor, Trouble walking and Weakness. Psychiatric Present- Change in Sleep Pattern. Not Present- Anxiety, Bipolar, Depression, Fearful and Frequent crying. Endocrine Present- New Diabetes. Not Present- Cold Intolerance, Excessive Hunger, Hair Changes, Heat Intolerance and Hot flashes. Hematology Not Present- Easy Bruising, Excessive bleeding, Gland problems, HIV and Persistent Infections.  Vitals Fay Records(Ashley Beck CMA; 04/03/2015 2:50 PM) 04/03/2015 2:50 PM Weight: 199 lb Height:  64in Body Surface Area: 1.95 m Body Mass Index: 34.16 kg/m  Temp.: 97.63F  Pulse: 98 (Regular)  BP: 134/74 (Sitting, Left Arm, Standard)      Physical Exam Emily Huynh(Ishanvi Mcquitty MD; 04/03/2015 5:43 PM)  General Mental Status-Alert. General Appearance-Consistent with stated age. Hydration-Well hydrated. Voice-Normal.  Head and Neck  Head-normocephalic, atraumatic with no lesions or palpable masses. Trachea-midline.  Eye Eyeball - Bilateral-Extraocular movements intact. Sclera/Conjunctiva - Bilateral-No scleral icterus.  Chest and Lung Exam Chest and lung exam reveals -quiet, even and easy respiratory effort with no use of accessory muscles and on auscultation, normal breath sounds, no adventitious sounds and normal vocal resonance. Inspection Chest Wall - Normal. Back - normal.  Cardiovascular Cardiovascular examination reveals -normal heart sounds, regular rate and rhythm with no murmurs and normal pedal pulses bilaterally.  Abdomen Inspection Inspection of the abdomen reveals - No Hernias. Palpation/Percussion Palpation and Percussion of the abdomen reveal - Soft, Non Tender, No Rebound tenderness, No Rigidity (guarding) and No hepatosplenomegaly. Auscultation Auscultation of the abdomen reveals - Bowel sounds normal.  Rectal Anorectal Exam External - Non-tender. Note: 2 external skin tags noted. Internal - normal sphincter tone, Non-tender.  Neurologic Neurologic evaluation reveals -alert and oriented x 3 with no impairment of recent or remote memory. Mental Status-Normal.  Musculoskeletal Global Assessment -Note:no gross deformities.  Normal Exam - Left-Upper Extremity Strength Normal and Lower Extremity Strength Normal. Normal Exam - Right-Upper Extremity Strength Normal and Lower Extremity Strength Normal.   Results Emily Levee MD; 04/03/2015 5:44 PM) Procedures  Name Value Date ANOSCOPY, DIAGNOSTIC (16109) [  Hemorrhoids ] Procedure Other: Procedure: Anoscopy Surgeon: Maisie Fus After the risks and benefits were explained, verbal consent was obtained for above procedure. A medical assistant chaperone was present thoroughout the entire procedure. Anesthesia: none Diagnosis: anal bleeding Findings: Right posterior internal hemorrhoid with associated skin tag, grade 1 internal hemorrhoids at other 2 positions.  Performed: 04/03/2015 5:44 PM    Assessment & Plan Emily Levee MD; 04/03/2015 5:43 PM)  PROLAPSED INTERNAL HEMORRHOIDS, GRADE 3 (U04.5) Impression: 65 year old female who presents to the office for follow-up for internal hemorrhoids. On exam today she has a enlarged and inflamed right posterior internal hemorrhoid and external component. We had a long discussion about rubber band ligation versus hemorrhoidectomy versus hemorrhoidal pexy. She has decided to proceed with hemorrhoidal resection due to problems with hygiene and cleanliness from her external skin tags. We will try to get this scheduled for her as soon as we can.

## 2015-07-19 ENCOUNTER — Inpatient Hospital Stay: Admission: RE | Admit: 2015-07-19 | Payer: BC Managed Care – PPO | Source: Ambulatory Visit

## 2015-07-27 ENCOUNTER — Ambulatory Visit (INDEPENDENT_AMBULATORY_CARE_PROVIDER_SITE_OTHER)
Admission: RE | Admit: 2015-07-27 | Discharge: 2015-07-27 | Disposition: A | Discharge status: Home / Self Care | Payer: BC Managed Care – PPO | Source: Ambulatory Visit | Attending: Emergency Medicine | Admitting: Emergency Medicine

## 2015-07-27 ENCOUNTER — Encounter (INDEPENDENT_AMBULATORY_CARE_PROVIDER_SITE_OTHER): Payer: Self-pay

## 2015-07-27 DIAGNOSIS — R938 Abnormal findings on diagnostic imaging of other specified body structures: Secondary | ICD-10-CM

## 2015-09-08 ENCOUNTER — Encounter: Payer: Self-pay | Admitting: Emergency Medicine

## 2015-09-08 ENCOUNTER — Ambulatory Visit (INDEPENDENT_AMBULATORY_CARE_PROVIDER_SITE_OTHER): Payer: BC Managed Care – PPO | Admitting: Emergency Medicine

## 2015-09-08 DIAGNOSIS — R590 Localized enlarged lymph nodes: Secondary | ICD-10-CM

## 2015-09-08 DIAGNOSIS — R059 Cough, unspecified: Secondary | ICD-10-CM

## 2015-09-08 DIAGNOSIS — R911 Solitary pulmonary nodule: Secondary | ICD-10-CM | POA: Diagnosis not present

## 2015-09-08 DIAGNOSIS — R599 Enlarged lymph nodes, unspecified: Secondary | ICD-10-CM | POA: Diagnosis not present

## 2015-09-08 DIAGNOSIS — R05 Cough: Secondary | ICD-10-CM | POA: Diagnosis not present

## 2015-09-08 NOTE — Assessment & Plan Note (Signed)
Improved on most recent CT scan of the chest from 07/27/15

## 2015-09-08 NOTE — Progress Notes (Signed)
Subjective:    Patient ID: Emily Huynh, female    DOB: 03/02/1950, 65 y.o.   MRN: [id number]  HPI 5662 never smoker with hx of HTN, DM, OSA, diverticular disease.  Began to have R sided CP about 3 months ago after a trip to [place]. Seems to be present at all times, worse w a deep breath. No real cough or SOB. She does not recall any physical injury. She underwent CT-PA 11/12/13 that showed no PE but did have a small nodule and R hilar lymph node.   ROV 01/21/14 -- follow up visit for R side CP that prompted a CT-PA 11/12/13. The scan was negative for PE but showed some non-specific R hilar nodal enlargement and a 3mm RUL nodule. She underwent repeat Ct scan 01/17/14 that showed no interval change in either finding. Her CP has improved some, but can still occur w a deep breath. It feels deep in her chest.   ROV 12/05/14 -- follow-up visit for abnormal CT scan of the chest dating back to 11/12/13. She had right hilar nodal enlargement with 3 mm right upper lobe nodule of unclear etiology. This is been stable on serial CTs most recently 07/19/14 which I personally reviewed. She is doing well, but continues to have a dry cough. A little more SOB which she ascribes to her weight.  She denies significant GERD presently. She is on loratadine.   ROV 09/08/15 -- This is a follow-up visit for a history of right hilar nodal enlargement and a 3 mm right upper lobe nodule of unclear etiology. These were first attacked it on a CT scan of the chest going back to 11/12/13. She has a history of cough in the setting of marginally controlled allergies and suspected GERD. Her most recent CT was on 07/27/15 that I personally reviewed. It shows a stable 4.5 mm right upper lobe nodule, unchanged from prior.  She is without complaints.    Review of Systems  Constitutional: Negative for fever and unexpected weight change.  HENT: Negative for congestion, dental problem, ear pain, nosebleeds, postnasal drip, rhinorrhea,  sinus pressure, sneezing, sore throat and trouble swallowing.   Eyes: Negative for redness and itching.  Respiratory: Negative for cough, chest tightness, shortness of breath and wheezing.   Cardiovascular: Positive for chest pain. Negative for palpitations and leg swelling.  Gastrointestinal: Negative for nausea and vomiting.  Genitourinary: Negative for dysuria.  Musculoskeletal: Negative for joint swelling.  Skin: Negative for rash.  Neurological: Negative for headaches.  Hematological: Does not bruise/bleed easily.  Psychiatric/Behavioral: Negative for dysphoric mood. The patient is not nervous/anxious.      Past Medical History:  Diagnosis Date  . Chronic constipation   . Diabetes mellitus without complication (HCC)   . Diverticulosis   . Hypertension   . IBS (irritable bowel syndrome)   . OSA (obstructive sleep apnea)      Family History  Problem Relation Age of Onset  . Diabetes Mother   . Hypertension Mother   . Dementia Mother     deceased  . Hypertension Brother   . Heart attack Maternal Grandfather   . Dementia Maternal Grandmother   . Cancer Maternal Aunt     Breast  . Diabetes Paternal Grandmother   . Heart attack Paternal Uncle   . Cancer - Prostate Father      Social History   Social History  . Marital status: Unknown    Spouse name: N/A  . Number of children: 3  .  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 Smoking status: Never Smoker  . Smokeless tobacco: Never Used  . Alcohol use No  . Drug use: No  . Sexual activity: Not on file   Other Topics Concern  . Not on file   Social History Narrative  . No narrative on file     No Known Allergies   Outpatient Medications Prior to Visit  Medication Sig Dispense Refill  . aspirin 81 MG chewable tablet Chew 81 mg by mouth as needed (3x week).    . fluticasone (FLONASE) 50 MCG/ACT nasal spray Place 2 sprays into both  nostrils daily. 16 g 2  . magnesium oxide (MAG-OX) 400 MG tablet Take 400 mg by mouth as needed (8 tabs weekly prn).    . vitamin C (ASCORBIC ACID) 500 MG tablet Take 500 mg by mouth as needed.    Marland Kitchen levothyroxine (SYNTHROID, LEVOTHROID) 50 MCG tablet Take 50 mcg by mouth daily before breakfast.   1   No facility-administered medications prior to visit.          Objective:   Physical Exam Vitals:   09/08/15 1123  BP: 124/80  Pulse: 66  SpO2: 97%  Weight: 194 lb (88 kg)  Height: 5\' 4"  (1.626 m)   Gen: Pleasant, overwt, in no distress,  normal affect  ENT: No lesions,  mouth clear,  oropharynx clear, no postnasal drip  Neck: No JVD, no TMG, no carotid bruits  Lungs: No use of accessory muscles,  clear without rales or rhonchi  Cardiovascular: RRR, heart sounds normal, no murmur or gallops, no peripheral edema  Musculoskeletal: No deformities, no cyanosis or clubbing  Neuro: alert, non focal  Skin: Warm, no lesions or rashes   07/19/14 --  COMPARISON: Chest CTs dated 11/12/2013 and 01/17/2014.  FINDINGS: Mediastinum/Nodes: Previously demonstrated prominent right hilar lymph node is not well seen without intravenous contrast, although is grossly stable. No progressive mediastinal or hilar adenopathy demonstrated. Generalized thyromegaly appears unchanged. The heart size is normal. There is no pericardial effusion.There are no significant vascular findings on noncontrast imaging for  Lungs/Pleura: There is no pleural effusion. 4 mm right upper lobe nodule on image 24 appears stable. No new or enlarging pulmonary nodules identified.  Upper abdomen: Hepatic steatosis and small low-density hepatic lesions on images 49 and 50 are stable. These are unchanged from abdominal CT done in 2010, consistent with benign findings.  Musculoskeletal/Chest wall: There is no chest wall mass or suspicious osseous finding.  IMPRESSION: 1. Stable right upper lobe pulmonary  nodule from baseline examination of 8 months ago. If the patient is at low risk for bronchogenic carcinoma, no further follow-up necessary. In a high risk patient, 12 months of follow-up are recommended; consider 1 additional follow-up examination in approximately 6 months. This recommendation follows the consensus statement: Guidelines for Management of Small Pulmonary Nodules Detected on CT Scans: A Statement from the Fleischner Society as published in Radiology 2005; 237:395-400. 2. No acute findings. 3. Grossly stable prominence of the right hilum. No progressive adenopathy identified on noncontrast imaging. 4. Stable low-density hepatic lesions     Assessment & Plan:  Cough Improved with control of her allergy symptoms.  Hilar lymphadenopathy Improved on most recent CT scan of the chest from 07/27/15  Solitary pulmonary nodule Stable 4.5 mm right upper lobe nodule in a low risk patient. She has no family history of malignancy.  I believe we can call this nodule benign and she will not need any more CT scans unless symptoms evolve.  Levy Pupa, MD, PhD 09/08/2015, 11:37 AM [facility] [phone] or if no answer [phone]

## 2015-09-08 NOTE — Assessment & Plan Note (Signed)
Improved with control of her allergy symptoms.

## 2015-09-08 NOTE — Patient Instructions (Signed)
Your CT scan of the chest shows a stable right upper lobe nodule. It hasn't changed over 2 years. It is likely benign. We shouldn't need to check any more scans unless unless you develop new symptoms.  Follow with Dr Delton CoombesByrum if needed.

## 2015-09-08 NOTE — Assessment & Plan Note (Signed)
Stable 4.5 mm right upper lobe nodule in a low risk patient. She has no family history of malignancy. I believe we can call this nodule benign and she will not need any more CT scans unless symptoms evolve.

## 2015-12-17 IMAGING — CT CT ANGIO CHEST
1 of 9 series · 14 of 36 positions shown · IV contrast (Iohexol (Omnipaque 350))
Comparison: None

CLINICAL DATA: RIGHT anterior chest pain worse with inspiration
ongoing for a couple months question pulmonary embolism ; personal
history of hypertension, diabetes

EXAM:
CT ANGIOGRAPHY CHEST WITH CONTRAST
TECHNIQUE: Multidetector CT imaging of the chest was performed using the
standard protocol during bolus administration of intravenous
contrast. Multiplanar CT image reconstructions and MIPs were
obtained to evaluate the vascular anatomy.
CONTRAST:  70mL OMNIPAQUE IOHEXOL 350 MG/ML SOLN

[Series 407: thins pacs · axial · 0.62mm/px · z∈[+8,+224]mm · 14 of 250 slices shown]
[im 17/250  lung]
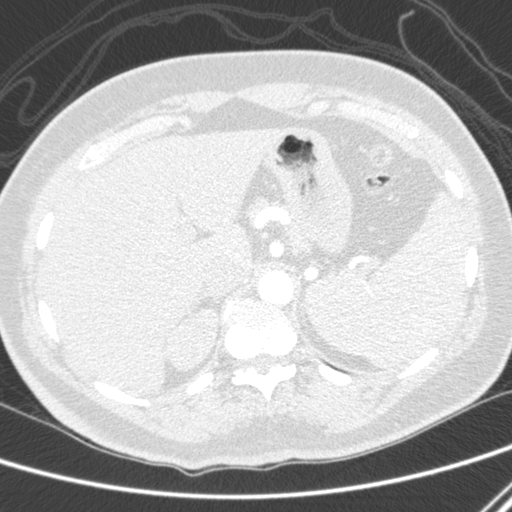
[im 34/250  mediastinal]
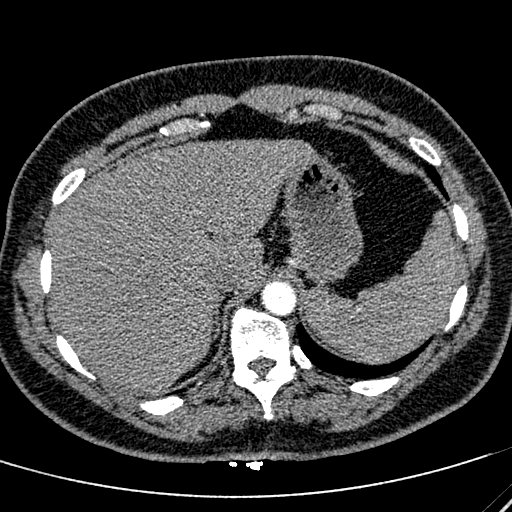
[im 50/250  lung]
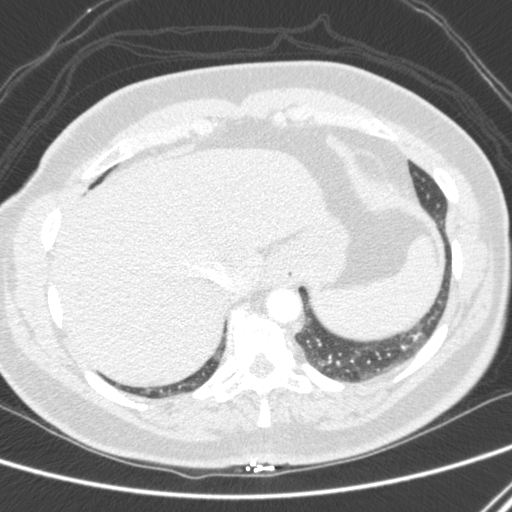
[im 67/250  mediastinal]
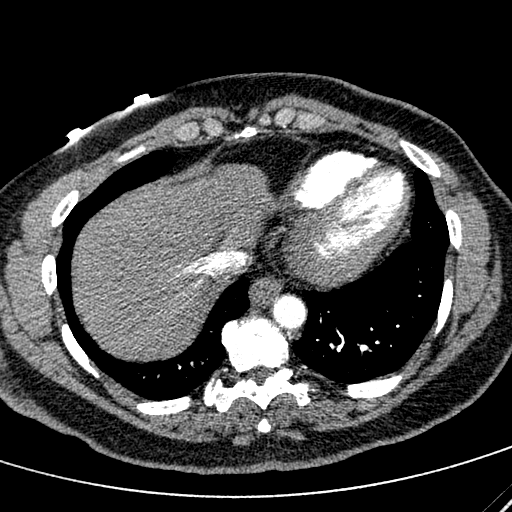
[im 84/250  lung]
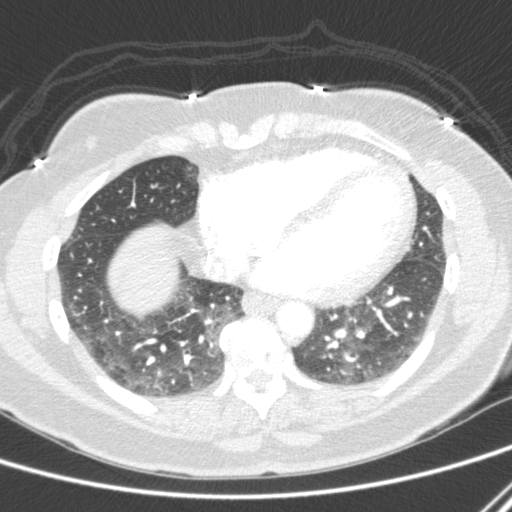
[im 100/250  mediastinal]
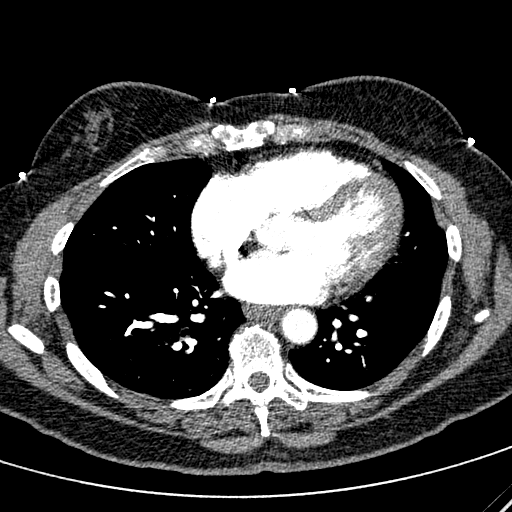
[im 117/250  lung]
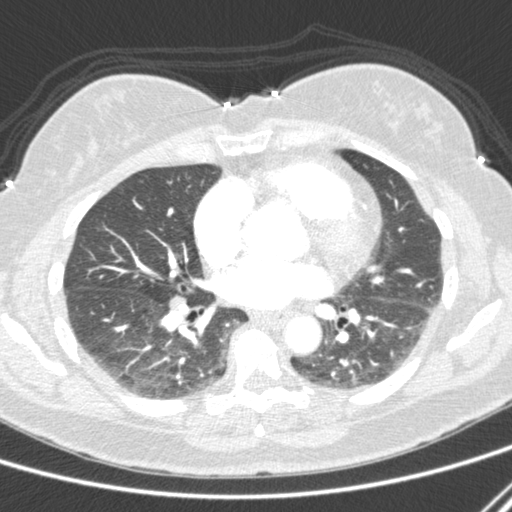
[im 133/250  mediastinal]
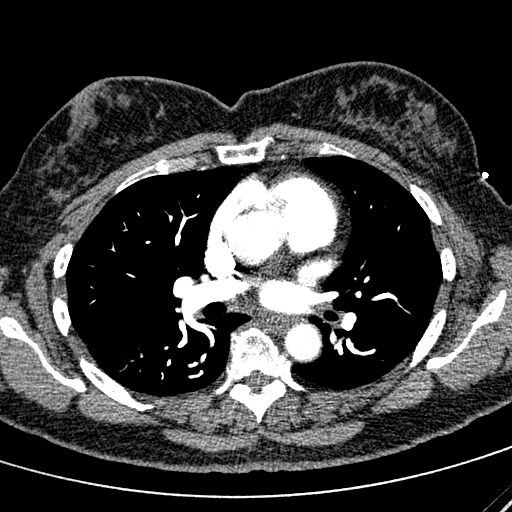
[im 150/250  lung]
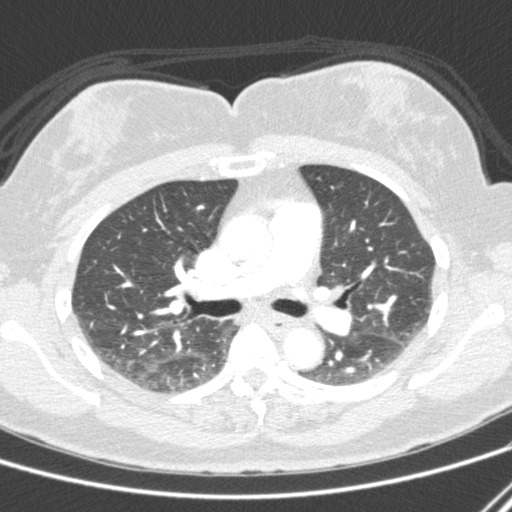
[im 167/250  mediastinal]
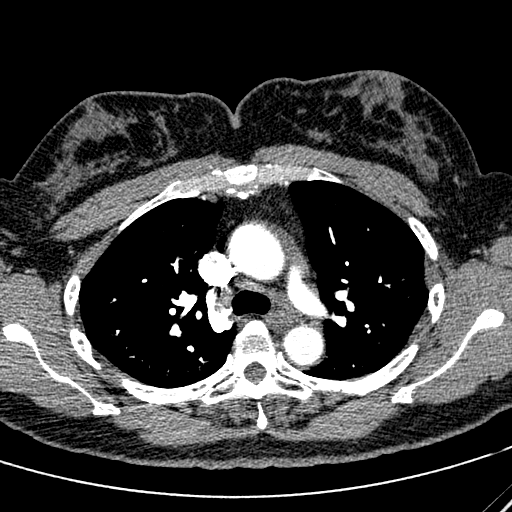
[im 183/250  lung]
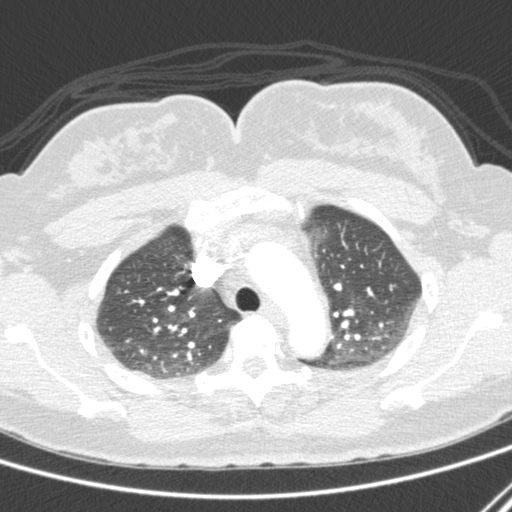
[im 200/250  mediastinal]
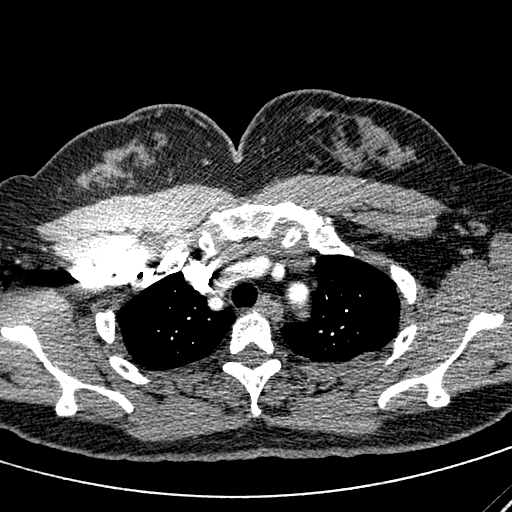
[im 216/250  lung]
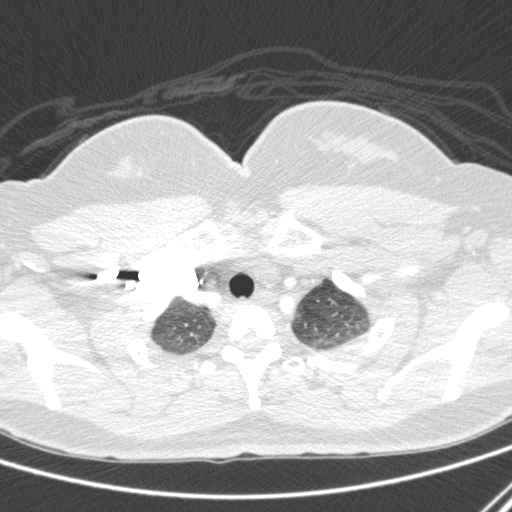
[im 233/250  mediastinal]
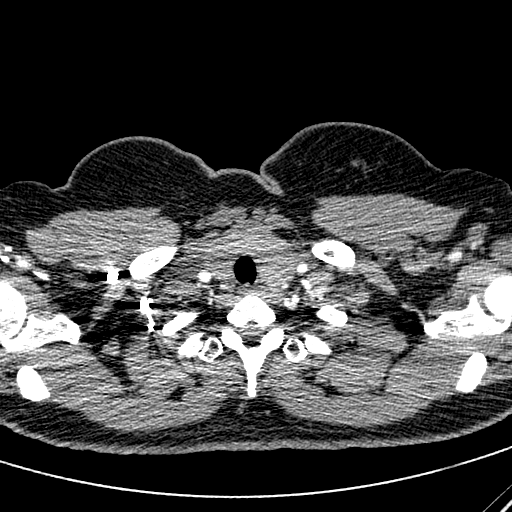

[14 of 36 positions shown; findings below may reference images not displayed]

FINDINGS: Diffuse thyroid enlargement.

Aorta normal caliber without aneurysm or dissection.

Minimally enlarged RIGHT hilar lymph node 13 mm short axis image 44.

No additional thoracic adenopathy.

Pulmonary arteries well opacified and patent.

No evidence of pulmonary embolism.

Visualized portion of upper abdomen shows no acute abnormalities.

Minimal nonspecific ground-glass infiltrate in the lower lobes
bilaterally could reflect atelectasis, mild edema or alveolitis.

Remaining lungs clear.

No segmental consolidation, pleural effusion, pneumothorax or
pulmonary mass/nodule.

No acute osseous findings.

Review of the MIP images confirms the above findings.
IMPRESSION: No evidence of pulmonary embolism.

Single nonspecific enlarged RIGHT hilar lymph node.

Minimal nonspecific ground-glass infiltrate in the lower lobes
question related to atelectasis, minimal edema or alveolitis.

Diffuse thyroid enlargement.

Findings called to Barner at Dr. [[email or web address]] on 11/12/2013 at 1629
hr.

## 2016-02-26 ENCOUNTER — Ambulatory Visit (HOSPITAL_COMMUNITY)
Admission: EM | Admit: 2016-02-26 | Discharge: 2016-02-26 | Disposition: A | Discharge status: Home / Self Care | Payer: BC Managed Care – PPO

## 2016-04-08 ENCOUNTER — Other Ambulatory Visit: Payer: Self-pay | Admitting: Occupational Medicine

## 2016-04-08 ENCOUNTER — Ambulatory Visit: Payer: Self-pay

## 2016-04-08 DIAGNOSIS — M545 Low back pain: Secondary | ICD-10-CM

## 2016-06-27 ENCOUNTER — Other Ambulatory Visit: Payer: Self-pay | Admitting: Physical Medicine and Rehabilitation

## 2016-06-27 DIAGNOSIS — M545 Low back pain: Principal | ICD-10-CM

## 2016-06-27 DIAGNOSIS — G8929 Other chronic pain: Secondary | ICD-10-CM

## 2016-07-27 ENCOUNTER — Encounter (HOSPITAL_COMMUNITY): Payer: Self-pay | Admitting: Emergency Medicine

## 2016-07-27 ENCOUNTER — Emergency Department (HOSPITAL_COMMUNITY)
Admission: EM | Admit: 2016-07-27 | Discharge: 2016-07-27 | Disposition: A | Discharge status: Home / Self Care | Payer: BC Managed Care – PPO | Attending: Emergency Medicine | Admitting: Emergency Medicine

## 2016-07-27 ENCOUNTER — Emergency Department (HOSPITAL_COMMUNITY): Payer: BC Managed Care – PPO

## 2016-07-27 DIAGNOSIS — Z7982 Long term (current) use of aspirin: Secondary | ICD-10-CM | POA: Diagnosis not present

## 2016-07-27 DIAGNOSIS — D649 Anemia, unspecified: Secondary | ICD-10-CM | POA: Insufficient documentation

## 2016-07-27 DIAGNOSIS — R110 Nausea: Secondary | ICD-10-CM | POA: Diagnosis not present

## 2016-07-27 DIAGNOSIS — E119 Type 2 diabetes mellitus without complications: Secondary | ICD-10-CM | POA: Insufficient documentation

## 2016-07-27 DIAGNOSIS — I10 Essential (primary) hypertension: Secondary | ICD-10-CM | POA: Diagnosis not present

## 2016-07-27 DIAGNOSIS — R079 Chest pain, unspecified: Secondary | ICD-10-CM | POA: Insufficient documentation

## 2016-07-27 HISTORY — DX: Disease of pericardium, unspecified: I31.9

## 2016-07-27 HISTORY — DX: Other intervertebral disc displacement, lumbar region: M51.26

## 2016-07-27 LAB — CBC
HCT: 33.9 % — ABNORMAL LOW (ref 36.0–46.0)
Hemoglobin: 11.1 g/dL — ABNORMAL LOW (ref 12.0–15.0)
MCH: 24.4 pg — AB (ref 26.0–34.0)
MCHC: 32.7 g/dL (ref 30.0–36.0)
MCV: 74.5 fL — ABNORMAL LOW (ref 78.0–100.0)
PLATELETS: 262 10*3/uL (ref 150–400)
RBC: 4.55 MIL/uL (ref 3.87–5.11)
RDW: 16.3 % — ABNORMAL HIGH (ref 11.5–15.5)
WBC: 5.4 10*3/uL (ref 4.0–10.5)

## 2016-07-27 LAB — BASIC METABOLIC PANEL
Anion gap: 7 (ref 5–15)
BUN: 11 mg/dL (ref 6–20)
CALCIUM: 9.2 mg/dL (ref 8.9–10.3)
CO2: 28 mmol/L (ref 22–32)
CREATININE: 0.99 mg/dL (ref 0.44–1.00)
Chloride: 104 mmol/L (ref 101–111)
GFR calc Af Amer: >60 mL/min (ref >60)
GFR calc non Af Amer: 59 mL/min — ABNORMAL LOW (ref >60)
GLUCOSE: 112 mg/dL — AB (ref 65–99)
Potassium: 3.7 mmol/L (ref 3.5–5.1)
Sodium: 139 mmol/L (ref 135–145)

## 2016-07-27 LAB — POCT I-STAT TROPONIN I: TROPONIN I, POC: 0.01 ng/mL (ref 0.00–0.08)

## 2016-07-27 NOTE — ED Provider Notes (Signed)
[facility]-EMERGENCY DEPT Provider Note   CSN: [id number] Arrival date & time: 07/27/16  0731     History   Chief Complaint Chief Complaint  Patient presents with  . Chest Pain    HPI Emily Huynh is a 66 y.o. female.  Patient presents with intermittent chest pain for the past week without dyspnea, diaphoresis. Slight nausea. Symptoms are not associated activity, are intermittent, described as a pressing sensation. No radiation. No history of heart disease. Risk factors include mild hypertension and mild diabetes. She works as a Armed forces operational officergrant administrator      Past Medical History:  Diagnosis Date  . Chronic constipation   . Diabetes mellitus without complication (HCC)   . Diverticulosis   . Hypertension   . IBS (irritable bowel syndrome)   . OSA (obstructive sleep apnea)   . Pericarditis    in pt's 30s  . Ruptured lumbar disc     Patient Active Problem List   Diagnosis Date Noted  . Palpitations 01/25/2015  . Cough 12/05/2014  . Solitary pulmonary nodule 01/21/2014  . Hilar lymphadenopathy 01/21/2014    Past Surgical History:  Procedure Laterality Date  . TUBAL LIGATION  1976    OB History    No data available       Home Medications    Prior to Admission medications   Medication Sig Start Date End Date Taking? Authorizing Provider  aspirin 81 MG chewable tablet Chew 81 mg by mouth as needed (3x week).   Yes [provider]    Family History Family History  Problem Relation Age of Onset  . Diabetes Mother   . Hypertension Mother   . Dementia Mother        deceased  . Hypertension Brother   . Heart attack Maternal Grandfather   . Dementia Maternal Grandmother   . Cancer Maternal Aunt        Breast  . Diabetes Paternal Grandmother   . Heart attack Paternal Uncle   . Cancer - Prostate Father     Social History Social History  Substance Use Topics  . Smoking status: Never Smoker  . Smokeless tobacco: Never Used  . Alcohol use  No     Allergies   Patient has no known allergies.   Review of Systems Review of Systems  All other systems reviewed and are negative.    Physical Exam Updated Vital Signs BP 139/90 (BP Location: Right Arm)   Pulse 85   Temp 98.3 F (36.8 C) (Oral)   Resp 18   SpO2 94%   Physical Exam  Constitutional: She is oriented to person, place, and time. She appears well-developed and well-nourished.  HENT:  Head: Normocephalic and atraumatic.  Eyes: Conjunctivae are normal.  Neck: Neck supple.  Cardiovascular: Normal rate and regular rhythm.   Pulmonary/Chest: Effort normal and breath sounds normal.  Abdominal: Soft. Bowel sounds are normal.  Musculoskeletal: Normal range of motion.  Neurological: She is alert and oriented to person, place, and time.  Skin: Skin is warm and dry.  Psychiatric: She has a normal mood and affect. Her behavior is normal.  Nursing note and vitals reviewed.    ED Treatments / Results  Labs (all labs ordered are listed, but only abnormal results are displayed) Labs Reviewed  BASIC METABOLIC PANEL - Abnormal; Notable for the following:       Result Value   Glucose, Bld 112 (*)    GFR calc non Af Amer 59 (*)  All other components within normal limits  CBC - Abnormal; Notable for the following:    Hemoglobin 11.1 (*)    HCT 33.9 (*)    MCV 74.5 (*)    MCH 24.4 (*)    RDW 16.3 (*)    All other components within normal limits  I-STAT TROPOININ, ED  POCT I-STAT TROPONIN I    EKG  EKG Interpretation  Date/Time:  Saturday July 27 2016 07:40:17 EDT Ventricular Rate:  72 PR Interval:    QRS Duration: 118 QT Interval:  475 QTC Calculation: 517 R Axis:   76 Text Interpretation:  Sinus rhythm Nonspecific intraventricular conduction delay Low voltage, precordial leads Confirmed by Donnetta Hutching ([id number]) on 07/27/2016 9:20:46 AM       Radiology Dg Chest 2 View  Result Date: 07/27/2016 CLINICAL DATA:  Intermittent chest pain for the past  week, worsening yesterday with nausea, indigestion and flushing sensations. EXAM: CHEST  2 VIEW COMPARISON:  Chest CT dated 07/27/2015. FINDINGS: Normal sized heart. Clear lungs with normal vascularity. Thoracic spine degenerative changes. IMPRESSION: No acute abnormality. Electronically Signed   By: Beckie Salts M.D.   On: 07/27/2016 08:19    Procedures Procedures (including critical care time)  Medications Ordered in ED Medications - No data to display   Initial Impression / Assessment and Plan / ED Course  I have reviewed the triage vital signs and the nursing notes.  Pertinent labs & imaging results that were available during my care of the patient were reviewed by me and considered in my medical decision making (see chart for details).     Patient presents with intermittent chest pain. Screening EKG, labs, chest x-ray all negative with the exception of mild anemia. Have strongly encouraged her to get cardiology follow-up. Discussed with patient and her family  Final Clinical Impressions(s) / ED Diagnoses   Final diagnoses:  Chest pain, unspecified type  Anemia, unspecified type    New Prescriptions New Prescriptions   No medications on file     Donnetta Hutching, MD 07/27/16 1227

## 2016-07-27 NOTE — ED Triage Notes (Addendum)
Pt reports L side CP for the past week accompanied by some nausea and hot flashes. Denies SOB, dizziness, or lightheadedness. Hx of pericarditis in her 30s.

## 2016-07-27 NOTE — ED Notes (Signed)
Pt transported to xray 

## 2016-07-27 NOTE — Discharge Instructions (Signed)
You are slightly anemic. Otherwise tests were good. Recommend follow-up with cardiologist.

## 2016-08-05 ENCOUNTER — Telehealth: Payer: Self-pay

## 2016-08-05 NOTE — Telephone Encounter (Signed)
Sent to scheduling

## 2018-08-11 ENCOUNTER — Other Ambulatory Visit: Payer: Self-pay

## 2018-08-11 DIAGNOSIS — Z20822 Contact with and (suspected) exposure to covid-19: Secondary | ICD-10-CM

## 2018-08-13 LAB — NOVEL CORONAVIRUS, NAA: SARS-CoV-2, NAA: NOT DETECTED

## 2019-01-06 ENCOUNTER — Ambulatory Visit: Payer: BC Managed Care – PPO | Attending: Internal Medicine

## 2019-01-06 DIAGNOSIS — Z20822 Contact with and (suspected) exposure to covid-19: Secondary | ICD-10-CM

## 2019-01-07 LAB — NOVEL CORONAVIRUS, NAA: SARS-CoV-2, NAA: NOT DETECTED

## 2019-08-24 ENCOUNTER — Ambulatory Visit: Payer: BC Managed Care – PPO | Attending: Internal Medicine

## 2019-08-24 DIAGNOSIS — Z23 Encounter for immunization: Secondary | ICD-10-CM

## 2019-08-24 NOTE — Progress Notes (Signed)
   Covid-19 Vaccination Clinic  Name:  Emily Huynh    MRN: [id number] DOB: 1950-01-27  08/24/2019  [name] was observed post Covid-19 immunization for 15 minutes without incident. She was provided with Vaccine Information Sheet and instruction to access the V-Safe system.   [name] was instructed to call 911 with any severe reactions post vaccine: Marland Kitchen Difficulty breathing  . Swelling of face and throat  . A fast heartbeat  . A bad rash all over body  . Dizziness and weakness   Immunizations Administered    Name Date Dose VIS Date Route   Pfizer COVID-19 Vaccine 08/24/2019 11:59 AM 0.3 mL 03/10/2018 Intramuscular   Manufacturer: [employer], [employer]   Lot: [id number]   NDC: [address]-1

## 2019-09-14 ENCOUNTER — Ambulatory Visit: Payer: BC Managed Care – PPO | Attending: Critical Care Medicine

## 2019-09-14 DIAGNOSIS — Z23 Encounter for immunization: Secondary | ICD-10-CM

## 2019-09-14 NOTE — Progress Notes (Signed)
   Covid-19 Vaccination Clinic  Name:  Emily Huynh    MRN: [id number] DOB: 1950/04/28  09/14/2019  [name] was observed post Covid-19 immunization for 15 minutes without incident. She was provided with Vaccine Information Sheet and instruction to access the V-Safe system.   [name] was instructed to call 911 with any severe reactions post vaccine: Marland Kitchen Difficulty breathing  . Swelling of face and throat  . A fast heartbeat  . A bad rash all over body  . Dizziness and weakness   Immunizations Administered    Name Date Dose VIS Date Route   Pfizer COVID-19 Vaccine 09/14/2019  3:51 PM 0.3 mL 03/10/2018 Intramuscular   Manufacturer: [employer], [employer]   Lot: [id number]   NDC: [address]-0

## 2021-04-02 ENCOUNTER — Other Ambulatory Visit: Payer: Self-pay | Admitting: Family Medicine

## 2021-04-02 DIAGNOSIS — R220 Localized swelling, mass and lump, head: Secondary | ICD-10-CM

## 2021-04-06 ENCOUNTER — Ambulatory Visit
Admission: RE | Admit: 2021-04-06 | Discharge: 2021-04-06 | Disposition: A | Discharge status: Home / Self Care | Payer: BC Managed Care – PPO | Source: Ambulatory Visit | Attending: Family Medicine | Admitting: Family Medicine

## 2021-04-06 DIAGNOSIS — R220 Localized swelling, mass and lump, head: Secondary | ICD-10-CM

## 2021-06-14 DIAGNOSIS — M47816 Spondylosis without myelopathy or radiculopathy, lumbar region: Secondary | ICD-10-CM | POA: Diagnosis not present

## 2021-07-12 DIAGNOSIS — N3000 Acute cystitis without hematuria: Secondary | ICD-10-CM | POA: Diagnosis not present

## 2021-08-09 DIAGNOSIS — E7800 Pure hypercholesterolemia, unspecified: Secondary | ICD-10-CM | POA: Diagnosis not present

## 2021-08-09 DIAGNOSIS — L680 Hirsutism: Secondary | ICD-10-CM | POA: Diagnosis not present

## 2021-08-09 DIAGNOSIS — L218 Other seborrheic dermatitis: Secondary | ICD-10-CM | POA: Diagnosis not present

## 2021-09-24 ENCOUNTER — Other Ambulatory Visit: Payer: Self-pay | Admitting: Gastroenterology

## 2021-09-24 DIAGNOSIS — R159 Full incontinence of feces: Secondary | ICD-10-CM | POA: Diagnosis not present

## 2021-10-08 DIAGNOSIS — M1732 Unilateral post-traumatic osteoarthritis, left knee: Secondary | ICD-10-CM | POA: Diagnosis not present

## 2022-02-13 ENCOUNTER — Encounter (HOSPITAL_COMMUNITY)
Admission: RE | Disposition: A | Discharge status: Home / Self Care | Payer: Self-pay | Source: Ambulatory Visit | Attending: Gastroenterology

## 2022-02-13 ENCOUNTER — Ambulatory Visit (HOSPITAL_COMMUNITY)
Admission: RE | Admit: 2022-02-13 | Discharge: 2022-02-13 | Disposition: A | Discharge status: Home / Self Care | Payer: Medicare PPO | Source: Ambulatory Visit | Attending: Gastroenterology | Admitting: Gastroenterology

## 2022-02-13 DIAGNOSIS — K5900 Constipation, unspecified: Secondary | ICD-10-CM | POA: Insufficient documentation

## 2022-02-13 HISTORY — PX: ANAL RECTAL MANOMETRY: SHX6358

## 2022-02-13 SURGERY — MANOMETRY, ANORECTAL

## 2022-02-13 NOTE — Progress Notes (Signed)
Anal rectal manometry performed per protocol without complications.  Patient tolerated well.  Balloon expulsion test performed per protocol without complications.  Patient tolerated well.  Uable to expel balloon after 3 minutes.

## 2022-02-14 DIAGNOSIS — K5900 Constipation, unspecified: Secondary | ICD-10-CM | POA: Diagnosis not present

## 2022-02-16 ENCOUNTER — Encounter (HOSPITAL_COMMUNITY): Payer: Self-pay | Admitting: Gastroenterology

## 2022-04-02 DIAGNOSIS — E119 Type 2 diabetes mellitus without complications: Secondary | ICD-10-CM | POA: Diagnosis not present

## 2022-04-02 DIAGNOSIS — E669 Obesity, unspecified: Secondary | ICD-10-CM | POA: Diagnosis not present

## 2022-04-02 DIAGNOSIS — G4733 Obstructive sleep apnea (adult) (pediatric): Secondary | ICD-10-CM | POA: Diagnosis not present

## 2022-04-02 DIAGNOSIS — E7800 Pure hypercholesterolemia, unspecified: Secondary | ICD-10-CM | POA: Diagnosis not present

## 2022-04-02 DIAGNOSIS — E039 Hypothyroidism, unspecified: Secondary | ICD-10-CM | POA: Diagnosis not present

## 2022-04-02 DIAGNOSIS — Z0000 Encounter for general adult medical examination without abnormal findings: Secondary | ICD-10-CM | POA: Diagnosis not present

## 2022-04-02 DIAGNOSIS — J309 Allergic rhinitis, unspecified: Secondary | ICD-10-CM | POA: Diagnosis not present

## 2022-04-02 DIAGNOSIS — R030 Elevated blood-pressure reading, without diagnosis of hypertension: Secondary | ICD-10-CM | POA: Diagnosis not present

## 2022-05-03 DIAGNOSIS — M47816 Spondylosis without myelopathy or radiculopathy, lumbar region: Secondary | ICD-10-CM | POA: Diagnosis not present

## 2022-05-13 DIAGNOSIS — M1732 Unilateral post-traumatic osteoarthritis, left knee: Secondary | ICD-10-CM | POA: Diagnosis not present

## 2022-05-17 DIAGNOSIS — H8111 Benign paroxysmal vertigo, right ear: Secondary | ICD-10-CM | POA: Diagnosis not present

## 2022-06-13 DIAGNOSIS — M47817 Spondylosis without myelopathy or radiculopathy, lumbosacral region: Secondary | ICD-10-CM | POA: Diagnosis not present

## 2022-06-13 DIAGNOSIS — M47816 Spondylosis without myelopathy or radiculopathy, lumbar region: Secondary | ICD-10-CM | POA: Diagnosis not present

## 2022-06-26 ENCOUNTER — Encounter: Payer: Self-pay | Admitting: Physical Therapy

## 2022-06-26 ENCOUNTER — Ambulatory Visit: Payer: Medicare PPO | Attending: Family Medicine | Admitting: Physical Therapy

## 2022-06-26 DIAGNOSIS — H8111 Benign paroxysmal vertigo, right ear: Secondary | ICD-10-CM | POA: Insufficient documentation

## 2022-06-26 NOTE — Patient Instructions (Signed)
How to Perform the Epley Maneuver The Epley maneuver is an exercise that relieves symptoms of vertigo. Vertigo is the feeling that you or your surroundings are moving when they are not. When you feel vertigo, you may feel like the room is spinning and may have trouble walking. The Epley maneuver is used for a type of vertigo caused by a calcium deposit in a part of the inner ear. The maneuver involves changing head positions to help the deposit move out of the area. You can do this maneuver at home whenever you have symptoms of vertigo. You can repeat it in 24 hours if your vertigo has not gone away. Even though the Epley maneuver may relieve your vertigo for a few weeks, it is possible that your symptoms will return. This maneuver relieves vertigo, but it does not relieve dizziness. What are the risks? If it is done correctly, the Epley maneuver is considered safe. Sometimes it can lead to dizziness or nausea that goes away after a short time. If you develop other symptoms--such as changes in vision, weakness, or numbness--stop doing the maneuver and call your health care provider. Supplies needed: A bed or table. A pillow. How to do the Epley maneuver     Sit on the edge of a bed or table with your back straight and your legs extended or hanging over the edge of the bed or table. Turn your head halfway toward the affected ear or side as told by your health care provider. Lie backward quickly with your head turned until you are lying flat on your back. Your head should dangle (head-hanging position). You may want to position a pillow under your shoulders. Hold this position for at least 30 seconds. If you feel dizzy or have symptoms of vertigo, continue to hold the position until the symptoms stop. Turn your head to the opposite direction until your unaffected ear is facing down. Your head should continue to dangle. Hold this position for at least 30 seconds. If you feel dizzy or have symptoms of  vertigo, continue to hold the position until the symptoms stop. Turn your whole body to the same side as your head so that you are positioned on your side. Your head will now be nearly facedown and no longer needs to dangle. Hold for at least 30 seconds. If you feel dizzy or have symptoms of vertigo, continue to hold the position until the symptoms stop. Sit back up. You can repeat the maneuver in 24 hours if your vertigo does not go away. Follow these instructions at home: For 24 hours after doing the Epley maneuver: Keep your head in an upright position. When lying down to sleep or rest, keep your head raised (elevated) with two or more pillows. Avoid excessive neck movements. Activity Do not drive or use machinery if you feel dizzy. After doing the Epley maneuver, return to your normal activities as told by your health care provider. Ask your health care provider what activities are safe for you. General instructions Drink enough fluid to keep your urine pale yellow. Do not drink alcohol. Take over-the-counter and prescription medicines only as told by your health care provider. Keep all follow-up visits. This is important. Preventing vertigo symptoms Ask your health care provider if there is anything you should do at home to prevent vertigo. He or she may recommend that you: Keep your head elevated with two or more pillows while you sleep. Do not sleep on the side of your affected ear. Get   up slowly from bed. Avoid sudden movements during the day. Avoid extreme head positions or movement, such as looking up or bending over. Contact a health care provider if: Your vertigo gets worse. You have other symptoms, including: Nausea. Vomiting. Headache. Get help right away if you: Have vision changes. Have a headache or neck pain that is severe or getting worse. Cannot stop vomiting. Have new numbness or weakness in any part of your body. These symptoms may represent a serious problem  that is an emergency. Do not wait to see if the symptoms will go away. Get medical help right away. Call your local emergency services (911 in the [place] Do not drive yourself to the hospital. Summary Vertigo is the feeling that you or your surroundings are moving when they are not. The Epley maneuver is an exercise that relieves symptoms of vertigo. If the Epley maneuver is done correctly, it is considered safe. This information is not intended to replace advice given to you by your health care provider. Make sure you discuss any questions you have with your health care provider. Document Revised: 12/01/2019 Document Reviewed: 12/01/2019 Elsevier Patient Education  2024 [employer].    Self Treatment for Right Posterior / Anterior Canalithiasis    Sitting on bed: 1. Turn head 45 right. (a) Lie back slowly, shoulders on pillow, head on bed. (b) Hold _30___ seconds. 2. Keeping head on bed, turn head 90 left. Hold _30___ seconds. 3. Roll to left, head on 45 angle down toward bed. Hold __30__ seconds. 4. Sit up on left side of bed. Repeat _3___ times per session. Do _2___ sessions per day. Until fully resolved  Copyright  [employer]. All rights reserved.

## 2022-06-26 NOTE — Therapy (Signed)
OUTPATIENT PHYSICAL THERAPY VESTIBULAR EVALUATION     Patient Name: Emily Huynh MRN: [id number] DOB:20-Nov-1950, 72 y.o., female Today's Date: 06/26/2022  END OF SESSION:  PT End of Session - 06/26/22 1150     Visit Number 1    Number of Visits 1    Authorization Type [employer]    Authorization Time Period 06-26-22 - 07-26-22    PT Start Time 1015    PT Stop Time 1055    PT Time Calculation (min) 40 min    Activity Tolerance Patient tolerated treatment well    Behavior During Therapy WFL for tasks assessed/performed             Past Medical History:  Diagnosis Date   Chronic constipation    Diabetes mellitus without complication (HCC)    Diverticulosis    Hypertension    IBS (irritable bowel syndrome)    OSA (obstructive sleep apnea)    Pericarditis    in pt's 30s   Ruptured lumbar disc    Past Surgical History:  Procedure Laterality Date   ANAL RECTAL MANOMETRY N/A 02/13/2022   Procedure: ANO RECTAL MANOMETRY;  Surgeon: Charlott Rakes, MD;  Location: [facility];  Service: Gastroenterology;  Laterality: N/A;   TUBAL LIGATION  1976   Patient Active Problem List   Diagnosis Date Noted   Palpitations 01/25/2015   Cough 12/05/2014   Solitary pulmonary nodule 01/21/2014   Hilar lymphadenopathy 01/21/2014    PCP: Shon Hale, MD REFERRING PROVIDER: Shon Hale, MD  REFERRING DIAG:  Diagnosis  H81.11 (ICD-10-CM) - Benign paroxysmal vertigo, right ear    THERAPY DIAG:  BPPV (benign paroxysmal positional vertigo), right  ONSET DATE: early April 2024:  Referral date 06-18-22  Rationale for Evaluation and Treatment: Rehabilitation  SUBJECTIVE:   SUBJECTIVE STATEMENT: Pt reports vertigo started approx. Early April - saw her PCP, Dr. Chanetta Marshall on May 3 for the vertigo- was prescribed medication (Meclizine?) for the dizziness and was told that it would resolve in a few days but it did not.  Referred to PT - pt was  really requesting ENT referral because pt has fullness in left ear and has had slight soreness on Lt side of throat but does have thyroid issues.  Woke up with vertigo on day of onset but did not have nausea or vomiting with it.  Pt reports she gets dizzy when she turns over onto Rt side during the night and also gets dizzy when she sits up; says she has not really been affected by walking or driving Pt accompanied by: self  PERTINENT HISTORY: HTN, DM  PAIN:  Are you having pain? No  PRECAUTIONS: None  WEIGHT BEARING RESTRICTIONS: No  FALLS: Has patient fallen in last 6 months? No   PLOF: Independent  PATIENT GOALS: resolve the vertigo  OBJECTIVE:   DIAGNOSTIC FINDINGS: N/A  COGNITION: Overall cognitive status: Within functional limits for tasks assessed  POSTURE:  No Significant postural limitations  Cervical ROM:  WFL's   STRENGTH: WFL's   BED MOBILITY:  Independent  GAIT: Gait pattern: WFL Distance walked: 64' Assistive device utilized: None Level of assistance: Complete Independence  PATIENT SURVEYS:  FOTO N/A - set up only  VESTIBULAR ASSESSMENT:  GENERAL OBSERVATION: pt is a    SYMPTOM BEHAVIOR:  Subjective history: pt reports symptoms started in early April - gets dizzy when she rolls onto her Rt side in bed  Non-Vestibular symptoms:  N/A  Type of dizziness: Spinning/Vertigo  Frequency:  usually daily  Duration: secs to minutes  Aggravating factors: Induced by position change: rolling to the right  Relieving factors: head stationary  Progression of symptoms: unchanged   POSITIONAL TESTING: Right Dix-Hallpike: upbeating, right nystagmus Left Dix-Hallpike: no nystagmus Right Sidelying: upbeating, right nystagmus Left Sidelying: no nystagmus   VESTIBULAR TREATMENT:                                                                                                   DATE: 06-26-22  Canalith Repositioning: Epley maneuver performed 3 reps for Rt BPPV  posterior canalithiasis - symptoms fully resolved on 3rd rep  PATIENT EDUCATION: Education details: etiology of BPPV Person educated: Patient Education method: Explanation and Handouts gave article from [employer] on BPPV Education comprehension: verbalized understanding  HOME EXERCISE PROGRAM:  N/A  GOALS:  N/A - eval only as vertigo resolved with treatment (Epley maneuver) and pt did not want to schedule a follow up appt at this time ASSESSMENT:  CLINICAL IMPRESSION: Patient is a 72 y.o. lady who was seen today for physical therapy evaluation and treatment for Rt BPPV (posterior canalithiasis).  Pt was treated with 3 reps of Epley and pt had no c/o vertigo or nystagmus on 3rd rep of Epley.  Pt did not wish to schedule a follow up appt at this time due to feeling symptoms are fully resolved.  PERSONAL FACTORS:  N/A  are also affecting patient's functional outcome.   REHAB POTENTIAL: Good  CLINICAL DECISION MAKING: Stable/uncomplicated  EVALUATION COMPLEXITY: Low   PLAN:  PT FREQUENCY: one time visit  PT DURATION: 1 week  PLANNED INTERVENTIONS: Patient/Family education, Self Care, Vestibular training, and Canalith repositioning; Initial evaluation  PLAN FOR NEXT SESSION: N/A - eval only per pt's request - BPPV appears to be fully resolved with treatment - pt stated she would call for follow up if needed   [name], Donavan Burnet, PT 06/26/2022, 11:51 AM

## 2022-08-06 DIAGNOSIS — M6289 Other specified disorders of muscle: Secondary | ICD-10-CM | POA: Diagnosis not present

## 2022-08-06 DIAGNOSIS — K5904 Chronic idiopathic constipation: Secondary | ICD-10-CM | POA: Diagnosis not present

## 2022-08-06 DIAGNOSIS — R151 Fecal smearing: Secondary | ICD-10-CM | POA: Diagnosis not present

## 2022-08-08 DIAGNOSIS — R3589 Other polyuria: Secondary | ICD-10-CM | POA: Diagnosis not present

## 2022-08-08 DIAGNOSIS — E1165 Type 2 diabetes mellitus with hyperglycemia: Secondary | ICD-10-CM | POA: Diagnosis not present

## 2022-11-14 DIAGNOSIS — E119 Type 2 diabetes mellitus without complications: Secondary | ICD-10-CM | POA: Diagnosis not present

## 2022-11-14 DIAGNOSIS — H2513 Age-related nuclear cataract, bilateral: Secondary | ICD-10-CM | POA: Diagnosis not present

## 2022-11-14 DIAGNOSIS — H43813 Vitreous degeneration, bilateral: Secondary | ICD-10-CM | POA: Diagnosis not present

## 2022-11-14 DIAGNOSIS — E113293 Type 2 diabetes mellitus with mild nonproliferative diabetic retinopathy without macular edema, bilateral: Secondary | ICD-10-CM | POA: Diagnosis not present

## 2022-11-14 DIAGNOSIS — R1032 Left lower quadrant pain: Secondary | ICD-10-CM | POA: Diagnosis not present

## 2023-01-30 DIAGNOSIS — Z7982 Long term (current) use of aspirin: Secondary | ICD-10-CM | POA: Diagnosis not present

## 2023-01-30 DIAGNOSIS — K5902 Outlet dysfunction constipation: Secondary | ICD-10-CM | POA: Diagnosis not present

## 2023-01-30 DIAGNOSIS — Z79899 Other long term (current) drug therapy: Secondary | ICD-10-CM | POA: Diagnosis not present

## 2023-01-30 DIAGNOSIS — R151 Fecal smearing: Secondary | ICD-10-CM | POA: Diagnosis not present

## 2023-02-04 DIAGNOSIS — Z1331 Encounter for screening for depression: Secondary | ICD-10-CM | POA: Diagnosis not present

## 2023-02-04 DIAGNOSIS — Z1231 Encounter for screening mammogram for malignant neoplasm of breast: Secondary | ICD-10-CM | POA: Diagnosis not present

## 2023-02-04 DIAGNOSIS — Z124 Encounter for screening for malignant neoplasm of cervix: Secondary | ICD-10-CM | POA: Diagnosis not present

## 2023-02-04 DIAGNOSIS — Z01411 Encounter for gynecological examination (general) (routine) with abnormal findings: Secondary | ICD-10-CM | POA: Diagnosis not present

## 2023-02-04 DIAGNOSIS — Z01419 Encounter for gynecological examination (general) (routine) without abnormal findings: Secondary | ICD-10-CM | POA: Diagnosis not present

## 2023-02-04 DIAGNOSIS — N3941 Urge incontinence: Secondary | ICD-10-CM | POA: Diagnosis not present

## 2023-02-07 DIAGNOSIS — Z1231 Encounter for screening mammogram for malignant neoplasm of breast: Secondary | ICD-10-CM | POA: Diagnosis not present

## 2023-02-17 DIAGNOSIS — E119 Type 2 diabetes mellitus without complications: Secondary | ICD-10-CM | POA: Diagnosis not present

## 2023-03-24 DIAGNOSIS — E039 Hypothyroidism, unspecified: Secondary | ICD-10-CM | POA: Diagnosis not present

## 2023-03-24 DIAGNOSIS — F322 Major depressive disorder, single episode, severe without psychotic features: Secondary | ICD-10-CM | POA: Diagnosis not present

## 2023-03-24 DIAGNOSIS — Z0000 Encounter for general adult medical examination without abnormal findings: Secondary | ICD-10-CM | POA: Diagnosis not present

## 2023-03-24 DIAGNOSIS — E119 Type 2 diabetes mellitus without complications: Secondary | ICD-10-CM | POA: Diagnosis not present

## 2023-03-24 DIAGNOSIS — E669 Obesity, unspecified: Secondary | ICD-10-CM | POA: Diagnosis not present

## 2023-03-24 DIAGNOSIS — I10 Essential (primary) hypertension: Secondary | ICD-10-CM | POA: Diagnosis not present

## 2023-03-24 DIAGNOSIS — G4733 Obstructive sleep apnea (adult) (pediatric): Secondary | ICD-10-CM | POA: Diagnosis not present

## 2023-03-24 DIAGNOSIS — D649 Anemia, unspecified: Secondary | ICD-10-CM | POA: Diagnosis not present

## 2023-03-24 DIAGNOSIS — J309 Allergic rhinitis, unspecified: Secondary | ICD-10-CM | POA: Diagnosis not present

## 2023-03-24 DIAGNOSIS — E7800 Pure hypercholesterolemia, unspecified: Secondary | ICD-10-CM | POA: Diagnosis not present

## 2023-04-16 DIAGNOSIS — F325 Major depressive disorder, single episode, in full remission: Secondary | ICD-10-CM | POA: Diagnosis not present

## 2023-04-16 DIAGNOSIS — J309 Allergic rhinitis, unspecified: Secondary | ICD-10-CM | POA: Diagnosis not present

## 2023-04-16 DIAGNOSIS — E039 Hypothyroidism, unspecified: Secondary | ICD-10-CM | POA: Diagnosis not present

## 2023-04-16 DIAGNOSIS — K5900 Constipation, unspecified: Secondary | ICD-10-CM | POA: Diagnosis not present

## 2023-04-16 DIAGNOSIS — E785 Hyperlipidemia, unspecified: Secondary | ICD-10-CM | POA: Diagnosis not present

## 2023-04-16 DIAGNOSIS — E119 Type 2 diabetes mellitus without complications: Secondary | ICD-10-CM | POA: Diagnosis not present

## 2023-04-16 DIAGNOSIS — M1990 Unspecified osteoarthritis, unspecified site: Secondary | ICD-10-CM | POA: Diagnosis not present

## 2023-04-16 DIAGNOSIS — N3941 Urge incontinence: Secondary | ICD-10-CM | POA: Diagnosis not present

## 2023-04-16 DIAGNOSIS — Z8249 Family history of ischemic heart disease and other diseases of the circulatory system: Secondary | ICD-10-CM | POA: Diagnosis not present

## 2023-05-02 DIAGNOSIS — R051 Acute cough: Secondary | ICD-10-CM | POA: Diagnosis not present

## 2023-05-02 DIAGNOSIS — U071 COVID-19: Secondary | ICD-10-CM | POA: Diagnosis not present

## 2023-05-02 DIAGNOSIS — R0989 Other specified symptoms and signs involving the circulatory and respiratory systems: Secondary | ICD-10-CM | POA: Diagnosis not present

## 2023-05-26 DIAGNOSIS — M25511 Pain in right shoulder: Secondary | ICD-10-CM | POA: Diagnosis not present

## 2023-06-11 DIAGNOSIS — E7800 Pure hypercholesterolemia, unspecified: Secondary | ICD-10-CM | POA: Diagnosis not present

## 2023-06-11 DIAGNOSIS — E66811 Obesity, class 1: Secondary | ICD-10-CM | POA: Diagnosis not present

## 2023-06-11 DIAGNOSIS — E039 Hypothyroidism, unspecified: Secondary | ICD-10-CM | POA: Diagnosis not present

## 2023-06-11 DIAGNOSIS — Z6831 Body mass index (BMI) 31.0-31.9, adult: Secondary | ICD-10-CM | POA: Diagnosis not present

## 2023-06-11 DIAGNOSIS — E1165 Type 2 diabetes mellitus with hyperglycemia: Secondary | ICD-10-CM | POA: Diagnosis not present

## 2023-06-11 DIAGNOSIS — Z1331 Encounter for screening for depression: Secondary | ICD-10-CM | POA: Diagnosis not present

## 2023-06-11 DIAGNOSIS — G4733 Obstructive sleep apnea (adult) (pediatric): Secondary | ICD-10-CM | POA: Diagnosis not present

## 2023-06-24 DIAGNOSIS — E66811 Obesity, class 1: Secondary | ICD-10-CM | POA: Diagnosis not present

## 2023-06-24 DIAGNOSIS — E1165 Type 2 diabetes mellitus with hyperglycemia: Secondary | ICD-10-CM | POA: Diagnosis not present

## 2023-06-24 DIAGNOSIS — G4733 Obstructive sleep apnea (adult) (pediatric): Secondary | ICD-10-CM | POA: Diagnosis not present

## 2023-06-24 DIAGNOSIS — E039 Hypothyroidism, unspecified: Secondary | ICD-10-CM | POA: Diagnosis not present

## 2023-06-24 DIAGNOSIS — Z6831 Body mass index (BMI) 31.0-31.9, adult: Secondary | ICD-10-CM | POA: Diagnosis not present

## 2023-06-24 DIAGNOSIS — E7800 Pure hypercholesterolemia, unspecified: Secondary | ICD-10-CM | POA: Diagnosis not present

## 2023-08-11 DIAGNOSIS — M47816 Spondylosis without myelopathy or radiculopathy, lumbar region: Secondary | ICD-10-CM | POA: Diagnosis not present

## 2023-08-12 DIAGNOSIS — M25511 Pain in right shoulder: Secondary | ICD-10-CM | POA: Diagnosis not present

## 2023-08-12 DIAGNOSIS — M549 Dorsalgia, unspecified: Secondary | ICD-10-CM | POA: Diagnosis not present

## 2023-08-21 DIAGNOSIS — M47816 Spondylosis without myelopathy or radiculopathy, lumbar region: Secondary | ICD-10-CM | POA: Diagnosis not present

## 2023-09-04 DIAGNOSIS — Z6831 Body mass index (BMI) 31.0-31.9, adult: Secondary | ICD-10-CM | POA: Diagnosis not present

## 2023-09-04 DIAGNOSIS — E66811 Obesity, class 1: Secondary | ICD-10-CM | POA: Diagnosis not present

## 2023-09-04 DIAGNOSIS — E7800 Pure hypercholesterolemia, unspecified: Secondary | ICD-10-CM | POA: Diagnosis not present

## 2023-09-04 DIAGNOSIS — G4733 Obstructive sleep apnea (adult) (pediatric): Secondary | ICD-10-CM | POA: Diagnosis not present

## 2023-09-04 DIAGNOSIS — E1165 Type 2 diabetes mellitus with hyperglycemia: Secondary | ICD-10-CM | POA: Diagnosis not present
